# Patient Record
Sex: Female | Born: 1935 | Race: White | Hispanic: No | Smoking: Never smoker
Health system: Southern US, Community
[De-identification: ages and names within clinical notes are randomized; demographics above are authoritative.]

## PROBLEM LIST (undated history)

## (undated) DIAGNOSIS — B009 Herpesviral infection, unspecified: Secondary | ICD-10-CM

## (undated) DIAGNOSIS — H9190 Unspecified hearing loss, unspecified ear: Secondary | ICD-10-CM

## (undated) DIAGNOSIS — F419 Anxiety disorder, unspecified: Secondary | ICD-10-CM

## (undated) DIAGNOSIS — M1990 Unspecified osteoarthritis, unspecified site: Secondary | ICD-10-CM

## (undated) DIAGNOSIS — K529 Noninfective gastroenteritis and colitis, unspecified: Secondary | ICD-10-CM

---

## 2016-07-22 DIAGNOSIS — K52831 Collagenous colitis: Secondary | ICD-10-CM | POA: Insufficient documentation

## 2016-07-22 DIAGNOSIS — F411 Generalized anxiety disorder: Secondary | ICD-10-CM | POA: Insufficient documentation

## 2016-07-22 DIAGNOSIS — Z8619 Personal history of other infectious and parasitic diseases: Secondary | ICD-10-CM | POA: Insufficient documentation

## 2016-07-22 DIAGNOSIS — M170 Bilateral primary osteoarthritis of knee: Secondary | ICD-10-CM | POA: Insufficient documentation

## 2017-01-25 DIAGNOSIS — Z66 Do not resuscitate: Secondary | ICD-10-CM | POA: Insufficient documentation

## 2017-01-25 DIAGNOSIS — Z0000 Encounter for general adult medical examination without abnormal findings: Secondary | ICD-10-CM | POA: Insufficient documentation

## 2017-02-03 DIAGNOSIS — C4492 Squamous cell carcinoma of skin, unspecified: Secondary | ICD-10-CM

## 2017-02-03 DIAGNOSIS — C4491 Basal cell carcinoma of skin, unspecified: Secondary | ICD-10-CM

## 2017-02-03 DIAGNOSIS — L570 Actinic keratosis: Secondary | ICD-10-CM

## 2017-02-03 HISTORY — DX: Basal cell carcinoma of skin, unspecified: C44.91

## 2017-02-03 HISTORY — DX: Actinic keratosis: L57.0

## 2017-02-03 HISTORY — DX: Squamous cell carcinoma of skin, unspecified: C44.92

## 2017-11-14 ENCOUNTER — Ambulatory Visit: Payer: Self-pay | Admitting: Urology

## 2017-12-05 ENCOUNTER — Ambulatory Visit: Payer: Self-pay | Admitting: Urology

## 2018-10-21 ENCOUNTER — Other Ambulatory Visit: Payer: Self-pay

## 2018-10-21 ENCOUNTER — Emergency Department: Payer: Medicare Other

## 2018-10-21 ENCOUNTER — Inpatient Hospital Stay
Admission: EM | Admit: 2018-10-21 | Discharge: 2018-10-23 | DRG: 313 | Disposition: A | Discharge status: Home / Self Care | Payer: Medicare Other | Attending: Internal Medicine | Admitting: Internal Medicine

## 2018-10-21 DIAGNOSIS — K819 Cholecystitis, unspecified: Secondary | ICD-10-CM | POA: Diagnosis present

## 2018-10-21 DIAGNOSIS — R0789 Other chest pain: Secondary | ICD-10-CM | POA: Diagnosis not present

## 2018-10-21 DIAGNOSIS — Z20828 Contact with and (suspected) exposure to other viral communicable diseases: Secondary | ICD-10-CM | POA: Diagnosis present

## 2018-10-21 DIAGNOSIS — R1013 Epigastric pain: Secondary | ICD-10-CM | POA: Diagnosis not present

## 2018-10-21 DIAGNOSIS — I10 Essential (primary) hypertension: Secondary | ICD-10-CM | POA: Diagnosis present

## 2018-10-21 DIAGNOSIS — Z882 Allergy status to sulfonamides status: Secondary | ICD-10-CM

## 2018-10-21 DIAGNOSIS — F419 Anxiety disorder, unspecified: Secondary | ICD-10-CM | POA: Diagnosis present

## 2018-10-21 DIAGNOSIS — R7989 Other specified abnormal findings of blood chemistry: Secondary | ICD-10-CM | POA: Diagnosis present

## 2018-10-21 HISTORY — DX: Anxiety disorder, unspecified: F41.9

## 2018-10-21 LAB — CBC
HCT: 44.6 % (ref 36.0–46.0)
Hemoglobin: 14.5 g/dL (ref 12.0–15.0)
MCH: 30.1 pg (ref 26.0–34.0)
MCHC: 32.5 g/dL (ref 30.0–36.0)
MCV: 92.5 fL (ref 80.0–100.0)
Platelets: 259 10*3/uL (ref 150–400)
RBC: 4.82 MIL/uL (ref 3.87–5.11)
RDW: 13.3 % (ref 11.5–15.5)
WBC: 11.4 10*3/uL — ABNORMAL HIGH (ref 4.0–10.5)
nRBC: 0 % (ref 0.0–0.2)

## 2018-10-21 LAB — BASIC METABOLIC PANEL
Anion gap: 8 (ref 5–15)
BUN: 16 mg/dL (ref 8–23)
CO2: 27 mmol/L (ref 22–32)
Calcium: 9.8 mg/dL (ref 8.9–10.3)
Chloride: 103 mmol/L (ref 98–111)
Creatinine, Ser: 0.7 mg/dL (ref 0.44–1.00)
GFR calc Af Amer: >60 mL/min (ref >60)
GFR calc non Af Amer: >60 mL/min (ref >60)
Glucose, Bld: 115 mg/dL — ABNORMAL HIGH (ref 70–99)
Potassium: 4.1 mmol/L (ref 3.5–5.1)
Sodium: 138 mmol/L (ref 135–145)

## 2018-10-21 LAB — HEPATIC FUNCTION PANEL
ALT: 48 U/L — ABNORMAL HIGH (ref 0–44)
AST: 83 U/L — ABNORMAL HIGH (ref 15–41)
Albumin: 4.3 g/dL (ref 3.5–5.0)
Alkaline Phosphatase: 90 U/L (ref 38–126)
Bilirubin, Direct: 0.5 mg/dL — ABNORMAL HIGH (ref 0.0–0.2)
Indirect Bilirubin: 0.8 mg/dL (ref 0.3–0.9)
Total Bilirubin: 1.3 mg/dL — ABNORMAL HIGH (ref 0.3–1.2)
Total Protein: 6.8 g/dL (ref 6.5–8.1)

## 2018-10-21 LAB — TROPONIN I (HIGH SENSITIVITY)
Troponin I (High Sensitivity): 5 ng/L (ref <18)
Troponin I (High Sensitivity): 4 ng/L (ref <18)

## 2018-10-21 LAB — LIPASE, BLOOD: Lipase: 40 U/L (ref 11–51)

## 2018-10-21 MED ORDER — IOHEXOL 350 MG/ML SOLN
75.0000 mL | Freq: Once | INTRAVENOUS | Status: AC | PRN
  Administered 2018-10-21: 75 mL via INTRAVENOUS

## 2018-10-21 MED ORDER — IBUPROFEN 600 MG PO TABS
600.0000 mg | ORAL_TABLET | Freq: Once | ORAL | Status: AC
  Administered 2018-10-22: 600 mg via ORAL
  Filled 2018-10-21: qty 1

## 2018-10-21 MED ORDER — SODIUM CHLORIDE 0.9 % IV SOLN
Freq: Once | INTRAVENOUS | Status: AC
  Administered 2018-10-22: via INTRAVENOUS

## 2018-10-21 MED ORDER — LABETALOL HCL 5 MG/ML IV SOLN
5.0000 mg | Freq: Once | INTRAVENOUS | Status: AC
  Administered 2018-10-22: 5 mg via INTRAVENOUS
  Filled 2018-10-21: qty 4

## 2018-10-21 MED ORDER — ONDANSETRON HCL 4 MG/2ML IJ SOLN
4.0000 mg | Freq: Four times a day (QID) | INTRAMUSCULAR | Status: DC | PRN
  Filled 2018-10-21: qty 2

## 2018-10-21 MED ORDER — LABETALOL HCL 5 MG/ML IV SOLN
2.5000 mg | Freq: Once | INTRAVENOUS | Status: AC
  Administered 2018-10-21: 2.5 mg via INTRAVENOUS
  Filled 2018-10-21: qty 4

## 2018-10-21 NOTE — ED Provider Notes (Signed)
[facility] Emergency Department Provider Note    First MD Initiated Contact with Patient 10/21/18 2029     (approximate)  I have reviewed the triage vital signs and the nursing notes.   HISTORY  Chief Complaint Back Pain and Chest Pain    HPI Laura Morrow is a 84 y.o. female close past medical history otherwise previously healthy presents to the ER with sudden onset midsternal chest pain that radiated through to her back.  States the pain lasted about 4hours.  Did not become diaphoretic.  Denied any numbness or tingling.  States she had a brief episode that was similar in nature roughly 1 week ago.  Symptoms were not worsened by exertion.  No nausea or vomiting.  Does not smoke.  States that the pain subsided since she has been in the ER.    Past Medical History:  Diagnosis Date   Anxiety    No family history on file. History reviewed. No pertinent surgical history. There are no active problems to display for this patient.     Prior to Admission medications   Not on File    Allergies Sulfa antibiotics    Social History Social History   Tobacco Use   Smoking status: Never Smoker  Substance Use Topics   Alcohol use: Not Currently   Drug use: Not on file    Review of Systems Patient denies headaches, rhinorrhea, blurry vision, numbness, shortness of breath, chest pain, edema, cough, abdominal pain, nausea, vomiting, diarrhea, dysuria, fevers, rashes or hallucinations unless otherwise stated above in HPI. ____________________________________________   PHYSICAL EXAM:  VITAL SIGNS: Vitals:   10/21/18 2100 10/21/18 2230  BP: (!) 171/91 (!) 175/93  Pulse: 77 73  Resp: 17 17  Temp:    SpO2: 96% 98%    Constitutional: Alert and oriented.  Eyes: Conjunctivae are normal.  Head: Atraumatic. Nose: No congestion/rhinnorhea. Mouth/Throat: Mucous membranes are moist.   Neck: No stridor. Painless ROM.  Cardiovascular: Normal  rate, regular rhythm. Grossly normal heart sounds.  Good peripheral circulation. Respiratory: Normal respiratory effort.  No retractions. Lungs CTAB. Gastrointestinal: Soft and nontender. No distention. No abdominal bruits. No CVA tenderness. Genitourinary:  Musculoskeletal: No lower extremity tenderness nor edema.  No joint effusions. Neurologic:  Normal speech and language. No gross focal neurologic deficits are appreciated. No facial droop Skin:  Skin is warm, dry and intact. No rash noted. Psychiatric: Mood and affect are normal. Speech and behavior are normal.  ____________________________________________   LABS (all labs ordered are listed, but only abnormal results are displayed)  Results for orders placed or performed during the hospital encounter of 10/21/18 (from the past 24 hour(s))  Basic metabolic panel     Status: Abnormal   Collection Time: 10/21/18  3:11 PM  Result Value Ref Range   Sodium 138 135 - 145 mmol/L   Potassium 4.1 3.5 - 5.1 mmol/L   Chloride 103 98 - 111 mmol/L   CO2 27 22 - 32 mmol/L   Glucose, Bld 115 (H) 70 - 99 mg/dL   BUN 16 8 - 23 mg/dL   Creatinine, Ser 0.70 0.44 - 1.00 mg/dL   Calcium 9.8 8.9 - 10.3 mg/dL   GFR calc non Af Amer >60 >60 mL/min   GFR calc Af Amer >60 >60 mL/min   Anion gap 8 5 - 15  CBC     Status: Abnormal   Collection Time: 10/21/18  3:11 PM  Result Value Ref Range   WBC 11.4 (  H) 4.0 - 10.5 K/uL   RBC 4.82 3.87 - 5.11 MIL/uL   Hemoglobin 14.5 12.0 - 15.0 g/dL   HCT 44.6 36.0 - 46.0 %   MCV 92.5 80.0 - 100.0 fL   MCH 30.1 26.0 - 34.0 pg   MCHC 32.5 30.0 - 36.0 g/dL   RDW 13.3 11.5 - 15.5 %   Platelets 259 150 - 400 K/uL   nRBC 0.0 0.0 - 0.2 %  Troponin I (High Sensitivity)     Status: None   Collection Time: 10/21/18  3:11 PM  Result Value Ref Range   Troponin I (High Sensitivity) 5 <18 ng/L  Hepatic function panel     Status: Abnormal   Collection Time: 10/21/18  3:11 PM  Result Value Ref Range   Total Protein 6.8  6.5 - 8.1 g/dL   Albumin 4.3 3.5 - 5.0 g/dL   AST 83 (H) 15 - 41 U/L   ALT 48 (H) 0 - 44 U/L   Alkaline Phosphatase 90 38 - 126 U/L   Total Bilirubin 1.3 (H) 0.3 - 1.2 mg/dL   Bilirubin, Direct 0.5 (H) 0.0 - 0.2 mg/dL   Indirect Bilirubin 0.8 0.3 - 0.9 mg/dL  Lipase, blood     Status: None   Collection Time: 10/21/18  3:11 PM  Result Value Ref Range   Lipase 40 11 - 51 U/L  Troponin I (High Sensitivity)     Status: None   Collection Time: 10/21/18  6:19 PM  Result Value Ref Range   Troponin I (High Sensitivity) 4 <18 ng/L   ____________________________________________  EKG My review and personal interpretation at Time: 14:54   Indication: chest pain  Rate: 70  Rhythm: sinus Axis: normal Other: normal intervals, no stemi ____________________________________________  RADIOLOGY  I personally reviewed all radiographic images ordered to evaluate for the above acute complaints and reviewed radiology reports and findings.  These findings were personally discussed with the patient.  Please see medical record for radiology report.  ____________________________________________   PROCEDURES  Procedure(s) performed:  Procedures    Critical Care performed: no ____________________________________________   INITIAL IMPRESSION / ASSESSMENT AND PLAN / ED COURSE  Pertinent labs & imaging results that were available during my care of the patient were reviewed by me and considered in my medical decision making (see chart for details).   DDX: ACS, pericarditis, esophagitis, boerhaaves, pe, dissection, pna, bronchitis, costochondritis   Laura Morrow is a 84 y.o. who presents to the ED with chest pain as described above.  Patient is low risk by heart score with serial enzymes showing no evidence of acute ischemia.  Does not seem clinically consistent with PE.  She was hypertensive and given the pain acutely starting her chest radiating through to her back CT imaging will be ordered to  exclude aneurysm or dissection.  Clinical Course as of Oct 20 2336  Sat Oct 21, 2018  2211 Patient does not have any significant tenderness palpation of the right upper quadrant but given the incidental finding on CT angiogram with her pain radiating through to her back will order ultrasound to further characterize and rule out cholelithiasis or cholecystitis.   [PR]  2322 Case discussed with Dr. Peyton Najjar of surgery.  Is recommended admission to hospitalist for HIDA scan and further medical management.  Discussed with patient.    [PR]    Clinical Course User Index [PR] Merlyn Lot, MD    The patient was evaluated in Emergency Department today for the symptoms described  in the history of present illness. He/she was evaluated in the context of the global COVID-19 pandemic, which necessitated consideration that the patient might be at risk for infection with the SARS-CoV-2 virus that causes COVID-19. Institutional protocols and algorithms that pertain to the evaluation of patients at risk for COVID-19 are in a state of rapid change based on information released by regulatory bodies including the CDC and federal and state organizations. These policies and algorithms were followed during the patient's care in the ED.  As part of my medical decision making, I reviewed the following data within the [facility] notes reviewed and incorporated, Labs reviewed, notes from prior ED visits and [place] Controlled Substance Database   ____________________________________________   FINAL CLINICAL IMPRESSION(S) / ED DIAGNOSES  Final diagnoses:  Epigastric pain      NEW MEDICATIONS STARTED DURING THIS VISIT:  New Prescriptions   No medications on file     Note:  This document was prepared using Dragon voice recognition software and may include unintentional dictation errors.    Merlyn Lot, MD 10/21/18 [phone]

## 2018-10-21 NOTE — ED Triage Notes (Signed)
Reports sudden onset of chest and back pain that started today at 12PM. Denies nausea, diaphoresis, similar onset a few weeks ago that lasted only a few minutes. Pt alert and oriented X4, active, cooperative, pt in NAD. RR even and unlabored, color WNL.

## 2018-10-21 NOTE — ED Notes (Signed)
Reports pain is gone. Inquiring about wait.

## 2018-10-21 NOTE — ED Notes (Signed)
First Nurse Note: Pt c/o chest and back pain x 3 hours. Pt is in NAD. Ambulatory without difficulty.

## 2018-10-22 ENCOUNTER — Other Ambulatory Visit: Payer: Self-pay

## 2018-10-22 ENCOUNTER — Other Ambulatory Visit: Payer: Medicare Other

## 2018-10-22 DIAGNOSIS — K819 Cholecystitis, unspecified: Secondary | ICD-10-CM | POA: Diagnosis present

## 2018-10-22 DIAGNOSIS — R079 Chest pain, unspecified: Secondary | ICD-10-CM

## 2018-10-22 DIAGNOSIS — Z882 Allergy status to sulfonamides status: Secondary | ICD-10-CM | POA: Diagnosis not present

## 2018-10-22 DIAGNOSIS — I10 Essential (primary) hypertension: Secondary | ICD-10-CM | POA: Diagnosis present

## 2018-10-22 DIAGNOSIS — R1013 Epigastric pain: Secondary | ICD-10-CM | POA: Diagnosis present

## 2018-10-22 DIAGNOSIS — Z20828 Contact with and (suspected) exposure to other viral communicable diseases: Secondary | ICD-10-CM | POA: Diagnosis present

## 2018-10-22 DIAGNOSIS — R0789 Other chest pain: Secondary | ICD-10-CM | POA: Diagnosis present

## 2018-10-22 DIAGNOSIS — R7989 Other specified abnormal findings of blood chemistry: Secondary | ICD-10-CM | POA: Diagnosis present

## 2018-10-22 DIAGNOSIS — F419 Anxiety disorder, unspecified: Secondary | ICD-10-CM | POA: Diagnosis present

## 2018-10-22 LAB — HEPATIC FUNCTION PANEL
ALT: 108 U/L — ABNORMAL HIGH (ref 0–44)
AST: 98 U/L — ABNORMAL HIGH (ref 15–41)
Albumin: 3.8 g/dL (ref 3.5–5.0)
Alkaline Phosphatase: 87 U/L (ref 38–126)
Bilirubin, Direct: 0.2 mg/dL (ref 0.0–0.2)
Indirect Bilirubin: 0.9 mg/dL (ref 0.3–0.9)
Total Bilirubin: 1.1 mg/dL (ref 0.3–1.2)
Total Protein: 6 g/dL — ABNORMAL LOW (ref 6.5–8.1)

## 2018-10-22 LAB — BASIC METABOLIC PANEL
Anion gap: 6 (ref 5–15)
BUN: 11 mg/dL (ref 8–23)
CO2: 24 mmol/L (ref 22–32)
Calcium: 9.2 mg/dL (ref 8.9–10.3)
Chloride: 107 mmol/L (ref 98–111)
Creatinine, Ser: 0.52 mg/dL (ref 0.44–1.00)
GFR calc Af Amer: >60 mL/min (ref >60)
GFR calc non Af Amer: >60 mL/min (ref >60)
Glucose, Bld: 111 mg/dL — ABNORMAL HIGH (ref 70–99)
Potassium: 3.9 mmol/L (ref 3.5–5.1)
Sodium: 137 mmol/L (ref 135–145)

## 2018-10-22 LAB — CBC
HCT: 40.8 % (ref 36.0–46.0)
Hemoglobin: 13.1 g/dL (ref 12.0–15.0)
MCH: 30.1 pg (ref 26.0–34.0)
MCHC: 32.1 g/dL (ref 30.0–36.0)
MCV: 93.8 fL (ref 80.0–100.0)
Platelets: 230 10*3/uL (ref 150–400)
RBC: 4.35 MIL/uL (ref 3.87–5.11)
RDW: 13.2 % (ref 11.5–15.5)
WBC: 8.3 10*3/uL (ref 4.0–10.5)
nRBC: 0 % (ref 0.0–0.2)

## 2018-10-22 LAB — TROPONIN I (HIGH SENSITIVITY)
Troponin I (High Sensitivity): 6 ng/L (ref <18)
Troponin I (High Sensitivity): 7 ng/L (ref <18)

## 2018-10-22 LAB — PROTIME-INR
INR: 1 (ref 0.8–1.2)
Prothrombin Time: 13.5 seconds (ref 11.4–15.2)

## 2018-10-22 LAB — SARS CORONAVIRUS 2 (TAT 6-24 HRS): SARS Coronavirus 2: NEGATIVE

## 2018-10-22 MED ORDER — ENOXAPARIN SODIUM 40 MG/0.4ML ~~LOC~~ SOLN
40.0000 mg | SUBCUTANEOUS | Status: DC
  Administered 2018-10-22 – 2018-10-23 (×2): 40 mg via SUBCUTANEOUS
  Filled 2018-10-22 (×2): qty 0.4

## 2018-10-22 MED ORDER — CITALOPRAM HYDROBROMIDE 20 MG PO TABS
40.0000 mg | ORAL_TABLET | Freq: Every day | ORAL | Status: DC
  Administered 2018-10-22 – 2018-10-23 (×2): 40 mg via ORAL
  Filled 2018-10-22 (×2): qty 2

## 2018-10-22 MED ORDER — ONDANSETRON HCL 4 MG PO TABS: 4.0000 mg | ORAL_TABLET | Freq: Four times a day (QID) | ORAL | Status: DC | PRN

## 2018-10-22 MED ORDER — ACETAMINOPHEN 325 MG PO TABS
650.0000 mg | ORAL_TABLET | Freq: Four times a day (QID) | ORAL | Status: DC | PRN
  Administered 2018-10-22: 650 mg via ORAL
  Filled 2018-10-22: qty 2

## 2018-10-22 MED ORDER — VALACYCLOVIR HCL 500 MG PO TABS
500.0000 mg | ORAL_TABLET | Freq: Every day | ORAL | Status: DC
  Administered 2018-10-22 – 2018-10-23 (×2): 500 mg via ORAL
  Filled 2018-10-22 (×2): qty 1

## 2018-10-22 MED ORDER — ONDANSETRON HCL 4 MG/2ML IJ SOLN: 4.0000 mg | Freq: Four times a day (QID) | INTRAMUSCULAR | Status: DC | PRN

## 2018-10-22 MED ORDER — SODIUM CHLORIDE 0.9 % IV SOLN
INTRAVENOUS | Status: DC
  Administered 2018-10-22 – 2018-10-23 (×3): via INTRAVENOUS

## 2018-10-22 NOTE — Progress Notes (Signed)
Patient complaint of mild HA. Covering provider, Gardiner Barefoot, notified. Verbal order for Tylenol taken.

## 2018-10-22 NOTE — ED Notes (Signed)
ED TO INPATIENT HANDOFF REPORT  ED Nurse Name and Phone #: Clifton James  S Name/Age/Gender Laura Morrow 84 y.o. female Room/Bed: ED18A/ED18A  Code Status   Code Status: Full Code  Home/SNF/Other Home Patient oriented to: self, place, time and situation Is this baseline? Yes   Triage Complete: Triage complete  Chief Complaint chest pains  Triage Note Reports sudden onset of chest and back pain that started today at 12PM. Denies nausea, diaphoresis, similar onset a few weeks ago that lasted only a few minutes. Pt alert and oriented X4, active, cooperative, pt in NAD. RR even and unlabored, color WNL.     Allergies Allergies  Allergen Reactions  . Sulfa Antibiotics Swelling    Level of Care/Admitting Diagnosis ED Disposition    ED Disposition Condition [facility] Area: [place] [[id number]]  Level of Care: Med-Surg [16]  Covid Evaluation: Asymptomatic Screening Protocol (No Symptoms)  Diagnosis: Cholecystitis [630160]  Admitting Physician: Mayer Camel [[id number]]  Attending Physician: Mayer Camel [[id number]]  Estimated length of stay: past midnight tomorrow  Certification:: I certify this patient will need inpatient services for at least 2 midnights  PT Class (Do Not Modify): Inpatient [101]  PT Acc Code (Do Not Modify): Private [1]       B Medical/Surgery History Past Medical History:  Diagnosis Date  . Anxiety    History reviewed. No pertinent surgical history.   A IV Location/Drains/Wounds Patient Lines/Drains/Airways Status   Active Line/Drains/Airways    Name:   Placement date:   Placement time:   Site:   Days:   Peripheral IV 10/21/18 Left Antecubital   10/21/18    2100    Antecubital   1          Intake/Output Last 24 hours No intake or output data in the 24 hours ending 10/22/18 0135  Labs/Imaging Results for orders placed or performed during the hospital encounter of 10/21/18 (from the past 48  hour(s))  Basic metabolic panel     Status: Abnormal   Collection Time: 10/21/18  3:11 PM  Result Value Ref Range   Sodium 138 135 - 145 mmol/L   Potassium 4.1 3.5 - 5.1 mmol/L   Chloride 103 98 - 111 mmol/L   CO2 27 22 - 32 mmol/L   Glucose, Bld 115 (H) 70 - 99 mg/dL   BUN 16 8 - 23 mg/dL   Creatinine, Ser 0.70 0.44 - 1.00 mg/dL   Calcium 9.8 8.9 - 10.3 mg/dL   GFR calc non Af Amer >60 >60 mL/min   GFR calc Af Amer >60 >60 mL/min   Anion gap 8 5 - 15    Comment: Performed at [facility], [address] [place], [place] [address]  CBC     Status: Abnormal   Collection Time: 10/21/18  3:11 PM  Result Value Ref Range   WBC 11.4 (H) 4.0 - 10.5 K/uL   RBC 4.82 3.87 - 5.11 MIL/uL   Hemoglobin 14.5 12.0 - 15.0 g/dL   HCT 44.6 36.0 - 46.0 %   MCV 92.5 80.0 - 100.0 fL   MCH 30.1 26.0 - 34.0 pg   MCHC 32.5 30.0 - 36.0 g/dL   RDW 13.3 11.5 - 15.5 %   Platelets 259 150 - 400 K/uL   nRBC 0.0 0.0 - 0.2 %    Comment: Performed at [facility], [address], [place], [place] [address]  Troponin I (High Sensitivity)  Status: None   Collection Time: 10/21/18  3:11 PM  Result Value Ref Range   Troponin I (High Sensitivity) 5 <18 ng/L    Comment: (NOTE) Elevated high sensitivity troponin I (hsTnI) values and significant  changes across serial measurements may suggest ACS but many other  chronic and acute conditions are known to elevate hsTnI results.  Refer to the "Links" section for chest pain algorithms and additional  guidance. Performed at [facility], [address] [place], [place] [address]   Hepatic function panel     Status: Abnormal   Collection Time: 10/21/18  3:11 PM  Result Value Ref Range   Total Protein 6.8 6.5 - 8.1 g/dL   Albumin 4.3 3.5 - 5.0 g/dL   AST 83 (H) 15 - 41 U/L   ALT 48 (H) 0 - 44 U/L   Alkaline Phosphatase 90 38 - 126 U/L   Total Bilirubin 1.3 (H) 0.3 - 1.2 mg/dL   Bilirubin, Direct 0.5 (H) 0.0 - 0.2 mg/dL    Indirect Bilirubin 0.8 0.3 - 0.9 mg/dL    Comment: Performed at [facility], [address] [place], [place] [address]  Lipase, blood     Status: None   Collection Time: 10/21/18  3:11 PM  Result Value Ref Range   Lipase 40 11 - 51 U/L    Comment: Performed at [facility], [place], [place] [address]  Troponin I (High Sensitivity)     Status: None   Collection Time: 10/21/18  6:19 PM  Result Value Ref Range   Troponin I (High Sensitivity) 4 <18 ng/L    Comment: (NOTE) Elevated high sensitivity troponin I (hsTnI) values and significant  changes across serial measurements may suggest ACS but many other  chronic and acute conditions are known to elevate hsTnI results.  Refer to the "Links" section for chest pain algorithms and additional  guidance. Performed at [facility], [address] [place], [place] [address]    Dg Chest 2 View  Result Date: 10/21/2018 CLINICAL DATA:  Patient reports central and right side CP that radiates into her back. Symptoms onset approx. noon today. Denies SOB, cough or fever. Denies any known cardiopulmonary conditions. Non-smoker. EXAM: CHEST - 2 VIEW COMPARISON:  None. FINDINGS: Cardiac silhouette is normal in size. No mediastinal or hilar masses or evidence of adenopathy. Lungs are hyperexpanded. Mildly prominent bronchovascular markings. Mild linear scarring noted in the lung bases and apices. No pneumonia or pulmonary edema. No pleural effusion or pneumothorax. Skeletal structures are demineralized. IMPRESSION: No acute cardiopulmonary disease. Electronically Signed   By: Lajean Manes M.D.   On: 10/21/2018 15:48   Ct Angio Chest Aorta W And/or Wo Contrast  Result Date: 10/21/2018 CLINICAL DATA:  Complex chest pain. EXAM: CT ANGIOGRAPHY CHEST WITH CONTRAST TECHNIQUE: Multidetector CT imaging of the chest was performed using the standard protocol during bolus administration of intravenous contrast. Multiplanar  CT image reconstructions and MIPs were obtained to evaluate the vascular anatomy. CONTRAST:  39mL OMNIPAQUE IOHEXOL 350 MG/ML SOLN COMPARISON:  None. FINDINGS: Cardiovascular: Evaluation is somewhat limited by contrast injection preferentially opacifying the aorta. Given this limitation, there was no PE identified. There is no dissection. There is no significant pericardial effusion. Coronary artery calcifications are noted. Mediastinum/Nodes: --No mediastinal or hilar lymphadenopathy. --No axillary lymphadenopathy. --No supraclavicular lymphadenopathy. --Normal thyroid gland. --The esophagus is unremarkable Lungs/Pleura: There is chronic appearing consolidation involving the right middle lobe and right lower lobe. There is atelectasis  versus scarring at the left lung base. No pneumothorax. No large pleural effusion. The trachea is unremarkable. Upper Abdomen: The partially visualized upper abdomen demonstrates diffuse gallbladder wall thickening of the partially visualized gallbladder. Musculoskeletal: No chest wall abnormality. No acute or significant osseous findings. Review of the MIP images confirms the above findings. IMPRESSION: 1. No PE identified.  No dissection. 2. Diffuse gallbladder wall thickening involving the partially visualized gallbladder. Follow-up with ultrasound is recommended to help exclude acute cholecystitis. Electronically Signed   By: Constance Holster M.D.   On: 10/21/2018 21:46   US Abdomen Limited Ruq  Result Date: 10/21/2018 CLINICAL DATA:  Epigastric pain EXAM: ULTRASOUND ABDOMEN LIMITED RIGHT UPPER QUADRANT COMPARISON:  None. FINDINGS: Gallbladder: There is diffuse gallbladder wall thickening with the gallbladder measuring 6 mm in thickness. There is pericholecystic free fluid. There is gallbladder sludge. The sonographic Percell Miller sign is reported as negative. Common bile duct: Diameter: 4 mm Liver: No focal lesion identified. Within normal limits in parenchymal echogenicity.  Portal vein is patent on color Doppler imaging with normal direction of blood flow towards the liver. Other: None. IMPRESSION: Gallbladder sludge with nonspecific gallbladder wall thickening. The sonographic Percell Miller sign is negative. If there is persistent clinical concern for acute cholecystitis follow-up with HIDA scan is recommended. Electronically Signed   By: Constance Holster M.D.   On: 10/21/2018 22:23    Pending Labs Unresulted Labs (From admission, onward)    Start     Ordered   10/29/18 0500  Creatinine, serum  (enoxaparin (LOVENOX)    CrCl >/= 30 ml/min)  Weekly,   STAT    Comments: while on enoxaparin therapy    10/22/18 0104   10/22/18 3662  Basic metabolic panel  Tomorrow morning,   STAT     10/22/18 0104   10/22/18 0500  CBC  Tomorrow morning,   STAT     10/22/18 0104   10/22/18 0500  Protime-INR  Tomorrow morning,   STAT     10/22/18 0104   10/22/18 0500  Hepatic function panel  Daily,   STAT     10/22/18 0104   10/22/18 0105  CBC  (enoxaparin (LOVENOX)    CrCl >/= 30 ml/min)  Once,   STAT    Comments: Baseline for enoxaparin therapy IF NOT ALREADY DRAWN.  Notify MD if PLT < 100 K.    10/22/18 0104   10/22/18 0105  Creatinine, serum  (enoxaparin (LOVENOX)    CrCl >/= 30 ml/min)  Once,   STAT    Comments: Baseline for enoxaparin therapy IF NOT ALREADY DRAWN.    10/22/18 0104          Vitals/Pain Today's Vitals   10/21/18 2230 10/21/18 2300 10/22/18 0000 10/22/18 0130  BP: (!) 175/93 (!) 197/99 (!) 170/91 (!) 153/92  Pulse: 73 80 72 67  Resp: 17 18 13  (!) 21  Temp:      TempSrc:      SpO2: 98% 99% 97% 94%  Weight:      Height:      PainSc:        Isolation Precautions No active isolations  Medications Medications  ondansetron (ZOFRAN) injection 4 mg (has no administration in time range)  citalopram (CELEXA) tablet 40 mg (has no administration in time range)  valACYclovir (VALTREX) tablet 500 mg (has no administration in time range)  enoxaparin  (LOVENOX) injection 40 mg (has no administration in time range)  0.9 %  sodium chloride infusion (has no administration in  time range)  ondansetron (ZOFRAN) tablet 4 mg (has no administration in time range)    Or  ondansetron (ZOFRAN) injection 4 mg (has no administration in time range)  labetalol (NORMODYNE) injection 2.5 mg (2.5 mg Intravenous Given 10/21/18 2107)  iohexol (OMNIPAQUE) 350 MG/ML injection 75 mL (75 mLs Intravenous Contrast Given 10/21/18 2126)  0.9 %  sodium chloride infusion ( Intravenous New Bag/Given 10/22/18 0022)  ibuprofen (ADVIL) tablet 600 mg (600 mg Oral Given 10/22/18 0019)  labetalol (NORMODYNE) injection 5 mg (5 mg Intravenous Given 10/22/18 0026)    Mobility walks Low fall risk   Focused Assessments Cardiac Assessment Handoff:  Cardiac Rhythm: Normal sinus rhythm No results found for: CKTOTAL, CKMB, CKMBINDEX, TROPONINI No results found for: DDIMER Does the Patient currently have chest pain? No      R Recommendations: See Admitting Provider Note  Report given to:   Additional Notes:

## 2018-10-22 NOTE — Plan of Care (Signed)

## 2018-10-22 NOTE — Progress Notes (Signed)
[facility] [place] NAME: Laura Morrow    MR#:  [id number]  DATE OF BIRTH:  1934/12/24  SUBJECTIVE:  CHIEF COMPLAINT:   Chief Complaint  Patient presents with  . Back Pain  . Chest Pain   Patient denies any chest pain this morning.  Denies any abdominal pains.  No fevers.  HIDA scan already ordered.  REVIEW OF SYSTEMS:  Review of Systems  Constitutional: Negative for chills and fever.  HENT: Negative for hearing loss and tinnitus.   Eyes: Negative for blurred vision and double vision.  Respiratory: Negative for cough and hemoptysis.   Cardiovascular: Negative for chest pain and palpitations.  Gastrointestinal: Negative for abdominal pain, heartburn, nausea and vomiting.  Genitourinary: Negative for dysuria and urgency.  Musculoskeletal: Negative for myalgias and neck pain.  Skin: Negative for itching and rash.  Neurological: Negative for dizziness and headaches.  Psychiatric/Behavioral: Negative for depression and hallucinations.    DRUG ALLERGIES:   Allergies  Allergen Reactions  . Sulfa Antibiotics Swelling   VITALS:  Blood pressure (!) 158/81, pulse 81, temperature 97.7 F (36.5 C), temperature source Oral, resp. rate 20, height 5\' 3"  (1.6 m), weight 63.8 kg, SpO2 97 %. PHYSICAL EXAMINATION:  Physical Exam  GENERAL:  84 y.o.-year-old patient lying in the bed with no acute distress.  EYES: Pupils equal, round, reactive to light and accommodation. No scleral icterus. Extraocular muscles intact.  HEENT: Head atraumatic, normocephalic. Oropharynx and nasopharynx clear.  NECK:  Supple, no jugular venous distention. No thyroid enlargement, no tenderness.  LUNGS: Normal breath sounds bilaterally, no wheezing, rales,rhonchi or crepitation. No use of accessory muscles of respiration.  CARDIOVASCULAR: Regular rate and rhythm, S1, S2 normal. No murmurs, rubs, or gallops.  ABDOMEN: Soft, nondistended, nontender. Bowel sounds  present. No organomegaly or mass.  EXTREMITIES: No pedal edema, cyanosis, or clubbing.  NEUROLOGIC: Cranial nerves II through XII are intact. Muscle strength 5/5 in all extremities. Sensation intact. Gait not checked.  PSYCHIATRIC: The patient is alert and oriented x 3.  Normal affect and good eye contact. SKIN: No obvious rash, lesion, or ulcer.   LABORATORY PANEL:  Female CBC Recent Labs  Lab 10/22/18 0429  WBC 8.3  HGB 13.1  HCT 40.8  PLT 230   ------------------------------------------------------------------------------------------------------------------ Chemistries  Recent Labs  Lab 10/22/18 0429  NA 137  K 3.9  CL 107  CO2 24  GLUCOSE 111*  BUN 11  CREATININE 0.52  CALCIUM 9.2  AST 98*  ALT 108*  ALKPHOS 87  BILITOT 1.1   RADIOLOGY:  Dg Chest 2 View  Result Date: 10/21/2018 CLINICAL DATA:  Patient reports central and right side CP that radiates into her back. Symptoms onset approx. noon today. Denies SOB, cough or fever. Denies any known cardiopulmonary conditions. Non-smoker. EXAM: CHEST - 2 VIEW COMPARISON:  None. FINDINGS: Cardiac silhouette is normal in size. No mediastinal or hilar masses or evidence of adenopathy. Lungs are hyperexpanded. Mildly prominent bronchovascular markings. Mild linear scarring noted in the lung bases and apices. No pneumonia or pulmonary edema. No pleural effusion or pneumothorax. Skeletal structures are demineralized. IMPRESSION: No acute cardiopulmonary disease. Electronically Signed   By: Lajean Manes M.D.   On: 10/21/2018 15:48   Ct Angio Chest Aorta W And/or Wo Contrast  Result Date: 10/21/2018 CLINICAL DATA:  Complex chest pain. EXAM: CT ANGIOGRAPHY CHEST WITH CONTRAST TECHNIQUE: Multidetector CT imaging of the chest was performed using the standard protocol during bolus administration of intravenous  contrast. Multiplanar CT image reconstructions and MIPs were obtained to evaluate the vascular anatomy. CONTRAST:  25mL OMNIPAQUE  IOHEXOL 350 MG/ML SOLN COMPARISON:  None. FINDINGS: Cardiovascular: Evaluation is somewhat limited by contrast injection preferentially opacifying the aorta. Given this limitation, there was no PE identified. There is no dissection. There is no significant pericardial effusion. Coronary artery calcifications are noted. Mediastinum/Nodes: --No mediastinal or hilar lymphadenopathy. --No axillary lymphadenopathy. --No supraclavicular lymphadenopathy. --Normal thyroid gland. --The esophagus is unremarkable Lungs/Pleura: There is chronic appearing consolidation involving the right middle lobe and right lower lobe. There is atelectasis versus scarring at the left lung base. No pneumothorax. No large pleural effusion. The trachea is unremarkable. Upper Abdomen: The partially visualized upper abdomen demonstrates diffuse gallbladder wall thickening of the partially visualized gallbladder. Musculoskeletal: No chest wall abnormality. No acute or significant osseous findings. Review of the MIP images confirms the above findings. IMPRESSION: 1. No PE identified.  No dissection. 2. Diffuse gallbladder wall thickening involving the partially visualized gallbladder. Follow-up with ultrasound is recommended to help exclude acute cholecystitis. Electronically Signed   By: Constance Holster M.D.   On: 10/21/2018 21:46   US Abdomen Limited Ruq  Result Date: 10/21/2018 CLINICAL DATA:  Epigastric pain EXAM: ULTRASOUND ABDOMEN LIMITED RIGHT UPPER QUADRANT COMPARISON:  None. FINDINGS: Gallbladder: There is diffuse gallbladder wall thickening with the gallbladder measuring 6 mm in thickness. There is pericholecystic free fluid. There is gallbladder sludge. The sonographic Percell Miller sign is reported as negative. Common bile duct: Diameter: 4 mm Liver: No focal lesion identified. Within normal limits in parenchymal echogenicity. Portal vein is patent on color Doppler imaging with normal direction of blood flow towards the liver. Other:  None. IMPRESSION: Gallbladder sludge with nonspecific gallbladder wall thickening. The sonographic Percell Miller sign is negative. If there is persistent clinical concern for acute cholecystitis follow-up with HIDA scan is recommended. Electronically Signed   By: Constance Holster M.D.   On: 10/21/2018 22:23   ASSESSMENT AND PLAN:   1.    Atypical chest pain Patient presented to the emergency room last night with atypical chest pain.  Resolved.  Admitted by nurse practitioner last night.  Patient completely asymptomatic this morning. Acute coronary syndrome ruled out. CTA chest done with no evidence of pulmonary embolism Likely musculoskeletal in origin. COVID tests ordered but results still pending.  2.  Gallbladder wall thickening Incidental finding on CT scan of the abdomen and pelvis.  Patient denies having any abdominal pains. Right upper quadrant ultrasound done revealed gallbladder sludge with nonspecific gallbladder wall thickening. Thesonographic Murphy sign is negative. If there is persistent clinical concern for acute cholecystitis follow-up with HIDA scan was recommended which has been ordered. Follow-up on report of HIDA scan.  Will consider surgery consult if HIDA scan suggestive of cholecystitis.  Clinically no evidence of cholecystitis at this time.  3.  Elevated LFTs - We will continue to trend LFTs Requested for hepatitis panel  DVT prophylaxis; Lovenox  All the records are reviewed and case discussed with Care Management/Social Worker. Management plans discussed with the patient, and she is in agreement.  CODE STATUS: Full Code  TOTAL TIME TAKING CARE OF THIS PATIENT: 35 minutes.   More than 50% of the time was spent in counseling/coordination of care: YES  POSSIBLE D/C IN 2 DAYS, DEPENDING ON CLINICAL CONDITION.   Skylene Deremer M.D on 10/22/2018 at 1:48 PM  Between 7am to 6pm - Pager - [phone]  After 6pm go to [email or web address] - [place]  [facility]  Office  [phone]  CC: Primary care physician; [place]  Note: This dictation was prepared with Dragon dictation along with smaller phrase technology. Any transcriptional errors that result from this process are unintentional.

## 2018-10-22 NOTE — H&P (Signed)
Laura Morrow NAME: Laura Morrow    MR#:  [id number]  DATE OF BIRTH:  1935/01/27  DATE OF ADMISSION:  10/21/2018  PRIMARY CARE PHYSICIAN: [place]   REQUESTING/REFERRING PHYSICIAN: Merlyn Lot, MD  CHIEF COMPLAINT:   Chief Complaint  Patient presents with   Back Pain   Chest Pain    HISTORY OF PRESENT ILLNESS:  Laura Morrow  is a 84 y.o. female with a known history of anxiety.  She presented to the emergency room reporting sudden onset midsternal chest pain radiating through to her back which began around 12 PM lasting 4 to 5 hours.  Pain was described as aching with a pain score 5 out of 10 with no alleviating or exacerbating factors.  Pain began while she was sitting at the kitchen table.  She denies associated nausea, vomiting, shortness of breath, diarrhea.  She denies palpitations or recent illness.  She denies cough.  She denies associated diaphoresis, numbness, tingling, or lightheadedness.  She recalls having had a similar episode which lasted less than 5 minutes and occurred about 1 week ago.  She has no prior known history of MI or CVA.  CTA chest demonstrated no evidence of pulmonary embolism or dissection.  However there was evidence of diffuse gallbladder wall thickening involving the partially visualized gallbladder recommending follow-up ultrasound.  Dominant ultrasound was completed demonstrating gallbladder sludge with nonspecific gallbladder wall thickening.  Murphy sign is negative.  Recommended follow-up with HIDA scan.  Chest x-ray was completed demonstrating no acute pulmonary disease.  COVID-19 testing results are pending.  WBC is 11.4.  Patient was noted to have elevated AST of 98 with ALT 108.  Troponin is 4.  She has been admitted to the hospitalist service for further evaluation and recommendations.  PAST MEDICAL HISTORY:   Past Medical History:  Diagnosis Date   Anxiety     PAST  SURGICAL HISTORY:  History reviewed. No pertinent surgical history.  SOCIAL HISTORY:   Social History   Tobacco Use   Smoking status: Never Smoker   Smokeless tobacco: Never Used  Substance Use Topics   Alcohol use: Not Currently    FAMILY HISTORY:  History reviewed. No pertinent family history.  DRUG ALLERGIES:   Allergies  Allergen Reactions   Sulfa Antibiotics Swelling    REVIEW OF SYSTEMS:   Review of Systems  Constitutional: Negative for chills, fever and malaise/fatigue.  HENT: Negative for congestion, sinus pain and sore throat.   Eyes: Negative for blurred vision and double vision.  Respiratory: Negative for hemoptysis, sputum production and shortness of breath.   Cardiovascular: Positive for chest pain. Negative for palpitations, orthopnea and leg swelling.  Gastrointestinal: Positive for abdominal pain (epigastric). Negative for diarrhea, heartburn, nausea and vomiting.  Genitourinary: Negative for dysuria, flank pain and hematuria.  Musculoskeletal: Positive for back pain. Negative for falls and myalgias.  Neurological: Negative for dizziness and headaches.  Psychiatric/Behavioral: Negative for depression.    MEDICATIONS AT HOME:   Prior to Admission medications   Medication Sig Start Date End Date Taking? Authorizing Provider  citalopram (CELEXA) 40 MG tablet Take 1 tablet by mouth daily. 07/21/18  Yes [provider]  valACYclovir (VALTREX) 500 MG tablet Take 500 mg by mouth daily. 07/21/18  Yes [provider]      VITAL SIGNS:  Blood pressure (!) 158/81, pulse 81, temperature 97.7 F (36.5 C), temperature source Oral, resp. rate 20, height 5\' 3"  (1.6 m),  weight 63.5 kg, SpO2 97 %.  PHYSICAL EXAMINATION:  Physical Exam  GENERAL:  84 y.o.-year-old patient lying in the bed with no acute distress.  EYES: Pupils equal, round, reactive to light and accommodation. No scleral icterus. Extraocular muscles intact.  HEENT: Head  atraumatic, normocephalic. Oropharynx and nasopharynx clear.  NECK:  Supple, no jugular venous distention. No thyroid enlargement, no tenderness.  LUNGS: Normal breath sounds bilaterally, no wheezing, rales,rhonchi or crepitation. No use of accessory muscles of respiration.  CARDIOVASCULAR: Regular rate and rhythm, S1, S2 normal. No murmurs, rubs, or gallops.  ABDOMEN: Soft, nondistended,mild upper abdominal tenderness. Bowel sounds present. No organomegaly or mass.  EXTREMITIES: No pedal edema, cyanosis, or clubbing.  NEUROLOGIC: Cranial nerves II through XII are intact. Muscle strength 5/5 in all extremities. Sensation intact. Gait not checked.  PSYCHIATRIC: The patient is alert and oriented x 3.  Normal affect and good eye contact. SKIN: No obvious rash, lesion, or ulcer.   LABORATORY PANEL:   CBC Recent Labs  Lab 10/22/18 0429  WBC 8.3  HGB 13.1  HCT 40.8  PLT 230   ------------------------------------------------------------------------------------------------------------------  Chemistries  Recent Labs  Lab 10/22/18 0429  NA 137  K 3.9  CL 107  CO2 24  GLUCOSE 111*  BUN 11  CREATININE 0.52  CALCIUM 9.2  AST 98*  ALT 108*  ALKPHOS 87  BILITOT 1.1   ------------------------------------------------------------------------------------------------------------------  Cardiac Enzymes No results for input(s): TROPONINI in the last 168 hours. ------------------------------------------------------------------------------------------------------------------  RADIOLOGY:  Dg Chest 2 View  Result Date: 10/21/2018 CLINICAL DATA:  Patient reports central and right side CP that radiates into her back. Symptoms onset approx. noon today. Denies SOB, cough or fever. Denies any known cardiopulmonary conditions. Non-smoker. EXAM: CHEST - 2 VIEW COMPARISON:  None. FINDINGS: Cardiac silhouette is normal in size. No mediastinal or hilar masses or evidence of adenopathy. Lungs are  hyperexpanded. Mildly prominent bronchovascular markings. Mild linear scarring noted in the lung bases and apices. No pneumonia or pulmonary edema. No pleural effusion or pneumothorax. Skeletal structures are demineralized. IMPRESSION: No acute cardiopulmonary disease. Electronically Signed   By: Lajean Manes M.D.   On: 10/21/2018 15:48   Ct Angio Chest Aorta W And/or Wo Contrast  Result Date: 10/21/2018 CLINICAL DATA:  Complex chest pain. EXAM: CT ANGIOGRAPHY CHEST WITH CONTRAST TECHNIQUE: Multidetector CT imaging of the chest was performed using the standard protocol during bolus administration of intravenous contrast. Multiplanar CT image reconstructions and MIPs were obtained to evaluate the vascular anatomy. CONTRAST:  38mL OMNIPAQUE IOHEXOL 350 MG/ML SOLN COMPARISON:  None. FINDINGS: Cardiovascular: Evaluation is somewhat limited by contrast injection preferentially opacifying the aorta. Given this limitation, there was no PE identified. There is no dissection. There is no significant pericardial effusion. Coronary artery calcifications are noted. Mediastinum/Nodes: --No mediastinal or hilar lymphadenopathy. --No axillary lymphadenopathy. --No supraclavicular lymphadenopathy. --Normal thyroid gland. --The esophagus is unremarkable Lungs/Pleura: There is chronic appearing consolidation involving the right middle lobe and right lower lobe. There is atelectasis versus scarring at the left lung base. No pneumothorax. No large pleural effusion. The trachea is unremarkable. Upper Abdomen: The partially visualized upper abdomen demonstrates diffuse gallbladder wall thickening of the partially visualized gallbladder. Musculoskeletal: No chest wall abnormality. No acute or significant osseous findings. Review of the MIP images confirms the above findings. IMPRESSION: 1. No PE identified.  No dissection. 2. Diffuse gallbladder wall thickening involving the partially visualized gallbladder. Follow-up with ultrasound  is recommended to help exclude acute cholecystitis. Electronically Signed   By:  Constance Holster M.D.   On: 10/21/2018 21:46   US Abdomen Limited Ruq  Result Date: 10/21/2018 CLINICAL DATA:  Epigastric pain EXAM: ULTRASOUND ABDOMEN LIMITED RIGHT UPPER QUADRANT COMPARISON:  None. FINDINGS: Gallbladder: There is diffuse gallbladder wall thickening with the gallbladder measuring 6 mm in thickness. There is pericholecystic free fluid. There is gallbladder sludge. The sonographic Percell Miller sign is reported as negative. Common bile duct: Diameter: 4 mm Liver: No focal lesion identified. Within normal limits in parenchymal echogenicity. Portal vein is patent on color Doppler imaging with normal direction of blood flow towards the liver. Other: None. IMPRESSION: Gallbladder sludge with nonspecific gallbladder wall thickening. The sonographic Percell Miller sign is negative. If there is persistent clinical concern for acute cholecystitis follow-up with HIDA scan is recommended. Electronically Signed   By: Constance Holster M.D.   On: 10/21/2018 22:23      IMPRESSION AND PLAN:   1.  Chest pain - We will continue to trend troponin levels -Repeat EKG in the a.m. -Telemetry monitoring -Lipid panel  2. acute cholecystitis - We will get HIDA scan as recommended and consult surgery if indicated at that time. - We will treat pain with analgesic  3.  Elevated LFTs - We will continue to trend LFTs  DVT and PPI prophylaxis    All the records are reviewed and case discussed with ED provider. The plan of care was discussed in details with the patient (and family). I answered all questions. The patient agreed to proceed with the above mentioned plan. Further management will depend upon hospital course.   CODE STATUS: Full code  TOTAL TIME TAKING CARE OF THIS PATIENT: 45 minutes.    Theo Dills Josy Peaden crnpon 10/22/2018 at 6:33 AM  Pager - [phone]  After 6pm go to [email or web address] - [employer]  [facility]  Office  [phone]  CC: Primary care physician; [place]   Note: This dictation was prepared with Dragon dictation along with smaller phrase technology. Any transcriptional errors that result from this process are unintentional.

## 2018-10-23 ENCOUNTER — Inpatient Hospital Stay: Payer: Medicare Other

## 2018-10-23 LAB — COMPREHENSIVE METABOLIC PANEL
ALT: 74 U/L — ABNORMAL HIGH (ref 0–44)
AST: 48 U/L — ABNORMAL HIGH (ref 15–41)
Albumin: 3.9 g/dL (ref 3.5–5.0)
Alkaline Phosphatase: 79 U/L (ref 38–126)
Anion gap: 5 (ref 5–15)
BUN: 10 mg/dL (ref 8–23)
CO2: 26 mmol/L (ref 22–32)
Calcium: 9.3 mg/dL (ref 8.9–10.3)
Chloride: 111 mmol/L (ref 98–111)
Creatinine, Ser: 0.58 mg/dL (ref 0.44–1.00)
GFR calc Af Amer: >60 mL/min (ref >60)
GFR calc non Af Amer: >60 mL/min (ref >60)
Glucose, Bld: 97 mg/dL (ref 70–99)
Potassium: 3.7 mmol/L (ref 3.5–5.1)
Sodium: 142 mmol/L (ref 135–145)
Total Bilirubin: 0.9 mg/dL (ref 0.3–1.2)
Total Protein: 6.1 g/dL — ABNORMAL LOW (ref 6.5–8.1)

## 2018-10-23 LAB — MAGNESIUM: Magnesium: 2.1 mg/dL (ref 1.7–2.4)

## 2018-10-23 MED ORDER — TECHNETIUM TC 99M MEBROFENIN IV KIT
5.0000 | PACK | Freq: Once | INTRAVENOUS | Status: AC | PRN
  Administered 2018-10-23: 5.34 via INTRAVENOUS

## 2018-10-23 NOTE — Progress Notes (Signed)
Per MD okay for RN to DC telemetry 

## 2018-10-23 NOTE — Progress Notes (Signed)
Per MD okay for RN to DC IVF and order [facility] healthy diet.

## 2018-10-23 NOTE — Progress Notes (Signed)
Kathaleya Recore  A and O x 4. VSS. Pt tolerating diet well. No complaints of pain or nausea. IV removed intact, prescriptions given. Pt voiced understanding of discharge instructions with no further questions. Pt discharged to [facility].   Allergies as of 10/23/2018      Reactions   Sulfa Antibiotics Swelling      Medication List    TAKE these medications   citalopram 40 MG tablet Commonly known as: CELEXA Take 1 tablet by mouth daily.   valACYclovir 500 MG tablet Commonly known as: VALTREX Take 500 mg by mouth daily.       Vitals:   10/23/18 1337 10/23/18 1454  BP: (!) 168/89 132/87  Pulse: 83 (!) 109  Resp:    Temp:    SpO2:      Francesco Sor

## 2018-10-23 NOTE — Discharge Summary (Signed)
[facility] [place] NAME: Laura Morrow    MR#:  [id number]  DATE OF BIRTH:  1934-08-30  DATE OF ADMISSION:  10/21/2018   ADMITTING PHYSICIAN: Christel Mormon, MD  DATE OF DISCHARGE: 10/23/2018  PRIMARY CARE PHYSICIAN: [place]   ADMISSION DIAGNOSIS:  Epigastric pain [R10.13] Hypertension, unspecified type [I10] DISCHARGE DIAGNOSIS:  Active Problems:   Cholecystitis  SECONDARY DIAGNOSIS:   Past Medical History:  Diagnosis Date  . Anxiety    HOSPITAL COURSE:  Chief complaint; chest pain  History of presenting complaint; Laura Morrow  is a 84 y.o. female with a known history of anxiety.  She presented to the emergency room reporting sudden onset midsternal chest pain radiating through to her back which began around 12 PM lasting 4 to 5 hours. CTA chest demonstrated no evidence of pulmonary embolism or dissection.  However there was evidence of diffuse gallbladder wall thickening involving the partially visualized gallbladder recommending follow-up ultrasound.  Dominant ultrasound was completed demonstrating gallbladder sludge with nonspecific gallbladder wall thickening.  Murphy sign is negative.  Recommended follow-up with HIDA scan.  Chest x-ray was completed demonstrating no acute pulmonary disease.   Hospital course; 1.  Atypical chest pain Patient presented to the emergency room last night with atypical chest pain.  Resolved.  Admitted by nurse practitioner last night.  Patient completely asymptomatic since admission. Acute coronary syndrome ruled out.CTA chest done with no evidence of pulmonary embolism Likely musculoskeletal in origin.COVID tests done was negative.  2.  Gallbladder wall thickening Incidental finding on CT scan of the abdomen and pelvis.  Patient denies having any abdominal pains.Right upper quadrant ultrasound done revealed gallbladder sludge with nonspecific gallbladder wall thickening.  Thesonographic Murphy sign is negative.  Patient subsequently had HIDA scan done which came back normal.  Patient remains completely asymptomatic and wishes to be discharged home today. Follow-up with primary care physician for ongoing monitoring  3. Elevated LFTs Trended down.  Hepatitis panel requested but results still pending.  Primary care physician to follow-up on results.  Patient completely asymptomatic.  No abdominal pains.  I called and updated daughter on treatment and discharge plans.  All questions were answered.  DISCHARGE CONDITIONS:  Stable CONSULTS OBTAINED:   DRUG ALLERGIES:   Allergies  Allergen Reactions  . Sulfa Antibiotics Swelling   DISCHARGE MEDICATIONS:   Allergies as of 10/23/2018      Reactions   Sulfa Antibiotics Swelling      Medication List    TAKE these medications   citalopram 40 MG tablet Commonly known as: CELEXA Take 1 tablet by mouth daily.   valACYclovir 500 MG tablet Commonly known as: VALTREX Take 500 mg by mouth daily.        DISCHARGE INSTRUCTIONS:   DIET:  Cardiac diet DISCHARGE CONDITION:  Stable ACTIVITY:  Activity as tolerated OXYGEN:  Home Oxygen: No.  Oxygen Delivery: room air DISCHARGE LOCATION:  home   If you experience worsening of your admission symptoms, develop shortness of breath, life threatening emergency, suicidal or homicidal thoughts you must seek medical attention immediately by calling 911 or calling your MD immediately  if symptoms less severe.  You Must read complete instructions/literature along with all the possible adverse reactions/side effects for all the Medicines you take and that have been prescribed to you. Take any new Medicines after you have completely understood and accpet all the possible adverse reactions/side effects.   Please note  You were cared for  by a hospitalist during your hospital stay. If you have any questions about your discharge medications or the care you received  while you were in the hospital after you are discharged, you can call the unit and asked to speak with the hospitalist on call if the hospitalist that took care of you is not available. Once you are discharged, your primary care physician will handle any further medical issues. Please note that NO REFILLS for any discharge medications will be authorized once you are discharged, as it is imperative that you return to your primary care physician (or establish a relationship with a primary care physician if you do not have one) for your aftercare needs so that they can reassess your need for medications and monitor your lab values.    On the day of Discharge:  VITAL SIGNS:  Blood pressure (!) 168/89, pulse 83, temperature 98.5 F (36.9 C), temperature source Oral, resp. rate 17, height 5\' 3"  (1.6 m), weight 63.8 kg, SpO2 96 %. PHYSICAL EXAMINATION:  GENERAL:  84 y.o.-year-old patient lying in the bed with no acute distress.  EYES: Pupils equal, round, reactive to light and accommodation. No scleral icterus. Extraocular muscles intact.  HEENT: Head atraumatic, normocephalic. Oropharynx and nasopharynx clear.  NECK:  Supple, no jugular venous distention. No thyroid enlargement, no tenderness.  LUNGS: Normal breath sounds bilaterally, no wheezing, rales,rhonchi or crepitation. No use of accessory muscles of respiration.  CARDIOVASCULAR: S1, S2 normal. No murmurs, rubs, or gallops.  ABDOMEN: Soft, non-tender, non-distended. Bowel sounds present. No organomegaly or mass.  EXTREMITIES: No pedal edema, cyanosis, or clubbing.  NEUROLOGIC: Cranial nerves II through XII are intact. Muscle strength 5/5 in all extremities. Sensation intact. Gait not checked.  PSYCHIATRIC: The patient is alert and oriented x 3.  SKIN: No obvious rash, lesion, or ulcer.  DATA REVIEW:   CBC Recent Labs  Lab 10/22/18 0429  WBC 8.3  HGB 13.1  HCT 40.8  PLT 230    Chemistries  Recent Labs  Lab 10/23/18 0541  NA 142   K 3.7  CL 111  CO2 26  GLUCOSE 97  BUN 10  CREATININE 0.58  CALCIUM 9.3  MG 2.1  AST 48*  ALT 74*  ALKPHOS 66  BILITOT 0.9     Microbiology Results  Results for orders placed or performed during the hospital encounter of 10/21/18  SARS CORONAVIRUS 2 Nasal Swab Aptima Multi Swab     Status: None   Collection Time: 10/22/18  2:15 AM   Specimen: Aptima Multi Swab; Nasal Swab  Result Value Ref Range Status   SARS Coronavirus 2 NEGATIVE NEGATIVE Final    Comment: (NOTE) SARS-CoV-2 target nucleic acids are NOT DETECTED. The SARS-CoV-2 RNA is generally detectable in upper and lower respiratory specimens during the acute phase of infection. Negative results do not preclude SARS-CoV-2 infection, do not rule out co-infections with other pathogens, and should not be used as the sole basis for treatment or other patient management decisions. Negative results must be combined with clinical observations, patient history, and epidemiological information. The expected result is Negative. Fact Sheet for Patients: SugarRoll.be Fact Sheet for Healthcare Providers: https://www.woods-mathews.com/ This test is not yet approved or cleared by the [place] FDA and  has been authorized for detection and/or diagnosis of SARS-CoV-2 by FDA under an Emergency Use Authorization (EUA). This EUA will remain  in effect (meaning this test can be used) for the duration of the COVID-19 declaration under Section 56 4(b)(1) of the Act,  21 U.S.C. section 360bbb-3(b)(1), unless the authorization is terminated or revoked sooner. Performed at [facility], [place] [address], [place], [place] [address]     RADIOLOGY:  Nm Hepatobiliary Liver Func  Result Date: 10/23/2018 CLINICAL DATA:  Right upper quadrant pain EXAM: NUCLEAR MEDICINE HEPATOBILIARY IMAGING TECHNIQUE: Sequential images of the abdomen were obtained out to 60 minutes following intravenous  administration of radiopharmaceutical. RADIOPHARMACEUTICALS:  5.34 mCi Tc-22m  Choletec IV COMPARISON:  Ultrasound right upper quadrant October 21, 2018 FINDINGS: Prompt uptake and biliary excretion of activity by the liver is seen. Gallbladder activity is visualized, consistent with patency of cystic duct. Biliary activity passes into small bowel, consistent with patent common bile duct. IMPRESSION: Study within normal limits. Electronically Signed   By: Lowella Grip III M.D.   On: 10/23/2018 13:26     Management plans discussed with the patient, family and they are in agreement.  CODE STATUS: Full Code   TOTAL TIME TAKING CARE OF THIS PATIENT: 34 minutes.    Yasemin Rabon M.D on 10/23/2018 at 1:51 PM  Between 7am to 6pm - Pager - [phone]  After 6pm go to [email or web address] - Proofreader  [employer]  Hospitalists  Office  [phone]  CC: Primary care physician; [place]   Note: This dictation was prepared with Dragon dictation along with smaller phrase technology. Any transcriptional errors that result from this process are unintentional.

## 2018-10-24 LAB — HEPATITIS PANEL, ACUTE
HCV Ab: <0.1 s/co ratio (ref 0.0–0.9)
Hep A IgM: NEGATIVE
Hep B C IgM: NEGATIVE
Hepatitis B Surface Ag: NEGATIVE

## 2019-07-06 ENCOUNTER — Emergency Department: Payer: Medicare Other

## 2019-07-06 ENCOUNTER — Inpatient Hospital Stay
Admission: EM | Admit: 2019-07-06 | Discharge: 2019-07-10 | DRG: 511 | Disposition: A | Discharge status: Home Health | Payer: Medicare Other | Attending: Hospitalist | Admitting: Hospitalist

## 2019-07-06 ENCOUNTER — Other Ambulatory Visit: Payer: Self-pay

## 2019-07-06 DIAGNOSIS — Z791 Long term (current) use of non-steroidal anti-inflammatories (NSAID): Secondary | ICD-10-CM

## 2019-07-06 DIAGNOSIS — F419 Anxiety disorder, unspecified: Secondary | ICD-10-CM | POA: Diagnosis present

## 2019-07-06 DIAGNOSIS — S3210XD Unspecified fracture of sacrum, subsequent encounter for fracture with routine healing: Secondary | ICD-10-CM | POA: Diagnosis not present

## 2019-07-06 DIAGNOSIS — S3210XA Unspecified fracture of sacrum, initial encounter for closed fracture: Secondary | ICD-10-CM

## 2019-07-06 DIAGNOSIS — R52 Pain, unspecified: Secondary | ICD-10-CM

## 2019-07-06 DIAGNOSIS — S52602A Unspecified fracture of lower end of left ulna, initial encounter for closed fracture: Secondary | ICD-10-CM | POA: Diagnosis present

## 2019-07-06 DIAGNOSIS — S52532A Colles' fracture of left radius, initial encounter for closed fracture: Principal | ICD-10-CM | POA: Diagnosis present

## 2019-07-06 DIAGNOSIS — Z882 Allergy status to sulfonamides status: Secondary | ICD-10-CM | POA: Diagnosis not present

## 2019-07-06 DIAGNOSIS — S32110A Nondisplaced Zone I fracture of sacrum, initial encounter for closed fracture: Secondary | ICD-10-CM | POA: Diagnosis present

## 2019-07-06 DIAGNOSIS — Z20822 Contact with and (suspected) exposure to covid-19: Secondary | ICD-10-CM | POA: Diagnosis present

## 2019-07-06 DIAGNOSIS — W19XXXD Unspecified fall, subsequent encounter: Secondary | ICD-10-CM | POA: Diagnosis not present

## 2019-07-06 DIAGNOSIS — S62102A Fracture of unspecified carpal bone, left wrist, initial encounter for closed fracture: Secondary | ICD-10-CM

## 2019-07-06 DIAGNOSIS — W1830XA Fall on same level, unspecified, initial encounter: Secondary | ICD-10-CM | POA: Diagnosis present

## 2019-07-06 DIAGNOSIS — Z79899 Other long term (current) drug therapy: Secondary | ICD-10-CM

## 2019-07-06 DIAGNOSIS — Z8781 Personal history of (healed) traumatic fracture: Secondary | ICD-10-CM

## 2019-07-06 DIAGNOSIS — Z419 Encounter for procedure for purposes other than remedying health state, unspecified: Secondary | ICD-10-CM

## 2019-07-06 DIAGNOSIS — W19XXXA Unspecified fall, initial encounter: Secondary | ICD-10-CM

## 2019-07-06 LAB — CBC WITH DIFFERENTIAL/PLATELET
Abs Immature Granulocytes: 0.1 10*3/uL — ABNORMAL HIGH (ref 0.00–0.07)
Basophils Absolute: 0 10*3/uL (ref 0.0–0.1)
Basophils Relative: 0 %
Eosinophils Absolute: 0 10*3/uL (ref 0.0–0.5)
Eosinophils Relative: 0 %
HCT: 42.4 % (ref 36.0–46.0)
Hemoglobin: 14 g/dL (ref 12.0–15.0)
Immature Granulocytes: 1 %
Lymphocytes Relative: 4 %
Lymphs Abs: 0.7 10*3/uL (ref 0.7–4.0)
MCH: 30.2 pg (ref 26.0–34.0)
MCHC: 33 g/dL (ref 30.0–36.0)
MCV: 91.6 fL (ref 80.0–100.0)
Monocytes Absolute: 1 10*3/uL (ref 0.1–1.0)
Monocytes Relative: 7 %
Neutro Abs: 12.9 10*3/uL — ABNORMAL HIGH (ref 1.7–7.7)
Neutrophils Relative %: 88 %
Platelets: 234 10*3/uL (ref 150–400)
RBC: 4.63 MIL/uL (ref 3.87–5.11)
RDW: 13.4 % (ref 11.5–15.5)
WBC: 14.7 10*3/uL — ABNORMAL HIGH (ref 4.0–10.5)
nRBC: 0 % (ref 0.0–0.2)

## 2019-07-06 LAB — BASIC METABOLIC PANEL
Anion gap: 10 (ref 5–15)
BUN: 18 mg/dL (ref 8–23)
CO2: 23 mmol/L (ref 22–32)
Calcium: 9.6 mg/dL (ref 8.9–10.3)
Chloride: 104 mmol/L (ref 98–111)
Creatinine, Ser: 1 mg/dL (ref 0.44–1.00)
GFR calc Af Amer: >60 mL/min (ref >60)
GFR calc non Af Amer: 52 mL/min — ABNORMAL LOW (ref >60)
Glucose, Bld: 130 mg/dL — ABNORMAL HIGH (ref 70–99)
Potassium: 3.9 mmol/L (ref 3.5–5.1)
Sodium: 137 mmol/L (ref 135–145)

## 2019-07-06 LAB — SARS CORONAVIRUS 2 (TAT 6-24 HRS): SARS Coronavirus 2: NEGATIVE

## 2019-07-06 MED ORDER — ONDANSETRON HCL 4 MG PO TABS: 4.0000 mg | ORAL_TABLET | Freq: Four times a day (QID) | ORAL | Status: DC | PRN

## 2019-07-06 MED ORDER — ENOXAPARIN SODIUM 40 MG/0.4ML ~~LOC~~ SOLN
40.0000 mg | SUBCUTANEOUS | Status: DC
  Administered 2019-07-06 – 2019-07-07 (×2): 40 mg via SUBCUTANEOUS
  Filled 2019-07-06 (×2): qty 0.4

## 2019-07-06 MED ORDER — MESALAMINE 1.2 G PO TBEC
1.2000 g | DELAYED_RELEASE_TABLET | Freq: Every day | ORAL | Status: DC
  Administered 2019-07-06 – 2019-07-10 (×4): 1.2 g via ORAL
  Filled 2019-07-06 (×5): qty 1

## 2019-07-06 MED ORDER — ONDANSETRON HCL 4 MG/2ML IJ SOLN: 4.0000 mg | Freq: Four times a day (QID) | INTRAMUSCULAR | Status: DC | PRN

## 2019-07-06 MED ORDER — MORPHINE SULFATE (PF) 2 MG/ML IV SOLN: 2.0000 mg | INTRAVENOUS | Status: DC | PRN

## 2019-07-06 MED ORDER — METHOCARBAMOL 500 MG PO TABS
500.0000 mg | ORAL_TABLET | Freq: Three times a day (TID) | ORAL | Status: DC
  Administered 2019-07-06 – 2019-07-10 (×11): 500 mg via ORAL
  Filled 2019-07-06 (×13): qty 1

## 2019-07-06 MED ORDER — ONDANSETRON HCL 4 MG/2ML IJ SOLN
4.0000 mg | Freq: Once | INTRAMUSCULAR | Status: AC
  Administered 2019-07-06: 05:00:00 4 mg via INTRAVENOUS
  Filled 2019-07-06: qty 2

## 2019-07-06 MED ORDER — OXYBUTYNIN CHLORIDE 5 MG PO TABS
5.0000 mg | ORAL_TABLET | Freq: Two times a day (BID) | ORAL | Status: DC
  Administered 2019-07-06 – 2019-07-09 (×5): 5 mg via ORAL
  Filled 2019-07-06 (×10): qty 1

## 2019-07-06 MED ORDER — FENTANYL CITRATE (PF) 100 MCG/2ML IJ SOLN
50.0000 ug | Freq: Once | INTRAMUSCULAR | Status: AC
  Administered 2019-07-06: 04:00:00 50 ug via INTRAVENOUS
  Filled 2019-07-06: qty 2

## 2019-07-06 MED ORDER — CITALOPRAM HYDROBROMIDE 20 MG PO TABS
40.0000 mg | ORAL_TABLET | Freq: Every day | ORAL | Status: DC
  Administered 2019-07-06 – 2019-07-10 (×4): 40 mg via ORAL
  Filled 2019-07-06 (×4): qty 2

## 2019-07-06 MED ORDER — ACETAMINOPHEN 650 MG RE SUPP: 650.0000 mg | Freq: Four times a day (QID) | RECTAL | Status: DC | PRN

## 2019-07-06 MED ORDER — VALACYCLOVIR HCL 500 MG PO TABS
500.0000 mg | ORAL_TABLET | Freq: Every day | ORAL | Status: DC
  Administered 2019-07-06 – 2019-07-10 (×4): 500 mg via ORAL
  Filled 2019-07-06 (×5): qty 1

## 2019-07-06 MED ORDER — MORPHINE SULFATE (PF) 2 MG/ML IV SOLN
2.0000 mg | Freq: Once | INTRAVENOUS | Status: AC
  Administered 2019-07-06: 05:00:00 2 mg via INTRAVENOUS
  Filled 2019-07-06: qty 1

## 2019-07-06 MED ORDER — VITAMIN D 25 MCG (1000 UNIT) PO TABS
5000.0000 [IU] | ORAL_TABLET | Freq: Every day | ORAL | Status: DC
  Administered 2019-07-06 – 2019-07-10 (×4): 5000 [IU] via ORAL
  Filled 2019-07-06 (×5): qty 5

## 2019-07-06 MED ORDER — ACETAMINOPHEN 325 MG PO TABS
650.0000 mg | ORAL_TABLET | Freq: Four times a day (QID) | ORAL | Status: DC | PRN
  Administered 2019-07-06 – 2019-07-10 (×6): 650 mg via ORAL
  Filled 2019-07-06 (×6): qty 2

## 2019-07-06 NOTE — ED Notes (Signed)
This RN to bedside at this time, introduced self to patient. Pt's daughter at bedside. Updated and explained plan of care to patient. Pt and daughter state understanding. Denies any needs. Call bell placed within reach of patient. Pt remains on 2L via [place] at this time.

## 2019-07-06 NOTE — Consult Note (Signed)
Reason for Consult: Left distal radius and ulna fracture along with sacral fractures Referring Physician: Dr. Jaquita Rector is an 85 y.o. female.  HPI: Patient suffered a fall last evening at home landing on both arms.  She had an abrasion to the right elbow and suffered a displaced Colles' fracture.  Additionally she complained of severe hip pain CT of the spine and hip were obtained that shows sacral fractures.  This consistent with a sacral insufficiency fracture.  She is unable to bear weight and has no history of prior back or hip symptoms.  She does have bilateral knee osteoarthritis.  She denies loss of consciousness.  Denies loss of bowel or bladder function or any numbness or tingling down the legs.  No numbness to the hand.  She is normally independent ambulator and lives alone.  Past Medical History:  Diagnosis Date  . Anxiety     History reviewed. No pertinent surgical history.  History reviewed. No pertinent family history.  Social History:  reports that she has never smoked. She has never used smokeless tobacco. She reports previous alcohol use. She reports previous drug use.  Allergies:  Allergies  Allergen Reactions  . Sulfa Antibiotics Swelling    Medications: I have reviewed the patient's current medications.  Results for orders placed or performed during the hospital encounter of 07/06/19 (from the past 48 hour(s))  Basic metabolic panel     Status: Abnormal   Collection Time: 07/06/19  3:51 AM  Result Value Ref Range   Sodium 137 135 - 145 mmol/L   Potassium 3.9 3.5 - 5.1 mmol/L   Chloride 104 98 - 111 mmol/L   CO2 23 22 - 32 mmol/L   Glucose, Bld 130 (H) 70 - 99 mg/dL    Comment: Glucose reference range applies only to samples taken after fasting for at least 8 hours.   BUN 18 8 - 23 mg/dL   Creatinine, Ser 1.00 0.44 - 1.00 mg/dL   Calcium 9.6 8.9 - 10.3 mg/dL   GFR calc non Af Amer 52 (L) >60 mL/min   GFR calc Af Amer >60 >60 mL/min   Anion gap 10  5 - 15    Comment: Performed at [facility], [place] [place], [place] [address]  CBC with Differential     Status: Abnormal   Collection Time: 07/06/19  3:51 AM  Result Value Ref Range   WBC 14.7 (H) 4.0 - 10.5 K/uL   RBC 4.63 3.87 - 5.11 MIL/uL   Hemoglobin 14.0 12.0 - 15.0 g/dL   HCT 42.4 36.0 - 46.0 %   MCV 91.6 80.0 - 100.0 fL   MCH 30.2 26.0 - 34.0 pg   MCHC 33.0 30.0 - 36.0 g/dL   RDW 13.4 11.5 - 15.5 %   Platelets 234 150 - 400 K/uL   nRBC 0.0 0.0 - 0.2 %   Neutrophils Relative % 88 %   Neutro Abs 12.9 (H) 1.7 - 7.7 K/uL   Lymphocytes Relative 4 %   Lymphs Abs 0.7 0.7 - 4.0 K/uL   Monocytes Relative 7 %   Monocytes Absolute 1.0 0.1 - 1.0 K/uL   Eosinophils Relative 0 %   Eosinophils Absolute 0.0 0.0 - 0.5 K/uL   Basophils Relative 0 %   Basophils Absolute 0.0 0.0 - 0.1 K/uL   Immature Granulocytes 1 %   Abs Immature Granulocytes 0.10 (H) 0.00 - 0.07 K/uL    Comment: Performed at [facility], [place]  [address], [place], [place] [address]    DG Elbow Complete Right  Result Date: 07/06/2019 CLINICAL DATA:  Left wrist deformity with skin tear. EXAM: RIGHT ELBOW - COMPLETE 3+ VIEW COMPARISON:  None. FINDINGS: There is no evidence of fracture, dislocation, or joint effusion. No opaque foreign body. Generalized osteopenic appearance IMPRESSION: No acute finding. Electronically Signed   By: Monte Fantasia M.D.   On: 07/06/2019 04:41   DG Wrist Complete Left  Result Date: 07/06/2019 CLINICAL DATA:  Fall with wrist deformity EXAM: LEFT WRIST - COMPLETE 3+ VIEW COMPARISON:  None. FINDINGS: Acute primarily transverse fracture through the distal radial metaphysis with mainly medial sided impaction. There is a branching fracture line extending longitudinally into the proximal diaphysis. Acute fracture at the metaphysis and styloid process of the ulna, nondisplaced. Regional soft tissue swelling. A true lateral view could not be obtained, there is gross  alignment at the wrist joint. Advanced first [place] and STT osteoarthritis. IMPRESSION: Distal radius and ulnar fractures with radial impaction. Electronically Signed   By: Monte Fantasia M.D.   On: 07/06/2019 04:42   CT Head Wo Contrast  Result Date: 07/06/2019 CLINICAL DATA:  Fall with wrist fracture. EXAM: CT HEAD WITHOUT CONTRAST TECHNIQUE: Contiguous axial images were obtained from the base of the skull through the vertex without intravenous contrast. COMPARISON:  None. FINDINGS: Brain: No evidence of acute infarction, hemorrhage, hydrocephalus, extra-axial collection or mass lesion/mass effect. Chronic small vessel ischemia in the deep cerebral white matter with lacunar infarct in the right thalamus that is chronic based on history. Vascular: Atherosclerotic calcification. Skull: Negative for fracture Sinuses/Orbits: No evidence of injury IMPRESSION: No evidence of intracranial injury. Electronically Signed   By: Monte Fantasia M.D.   On: 07/06/2019 04:51   CT Lumbar Spine Wo Contrast  Result Date: 07/06/2019 CLINICAL DATA:  Fall with left hip and lower back pain EXAM: CT LUMBAR SPINE WITHOUT CONTRAST TECHNIQUE: Multidetector CT imaging of the lumbar spine was performed without intravenous contrast administration. Multiplanar CT image reconstructions were also generated. COMPARISON:  None. FINDINGS: Segmentation: 5 lumbar type vertebrae based on the lowest ribs. Alignment: Slight anterolisthesis at L5-S1 Vertebrae: Small cortex fracture involving the lateral left sacral ala. No displacement or sacral foramina involvement. No vertebral fracture noted. Osteopenia. Paraspinal and other soft tissues: Diffuse atherosclerotic plaque. No acute soft tissue finding. Disc levels: Aside from L5-S1 there is relatively preserved disc height. Generalized degenerative facet spurring. There is up to moderate spinal stenosis at L3-4. IMPRESSION: Acute, nondisplaced left sacral ala fracture. Electronically Signed   By:  Monte Fantasia M.D.   On: 07/06/2019 06:12   CT Hip Left Wo Contrast  Result Date: 07/06/2019 CLINICAL DATA:  85 year old female with history of trauma from a fall complaining of left hip and low back pain. EXAM: CT OF THE LEFT HIP WITHOUT CONTRAST TECHNIQUE: Multidetector CT imaging of the left hip was performed according to the standard protocol. Multiplanar CT image reconstructions were also generated. COMPARISON:  No priors. FINDINGS: Bones/Joint/Cartilage No acute displaced fracture noted. Left femoral head is located. There is joint space narrowing, subchondral sclerosis, subchondral cyst formation and osteophyte formation in the left hip joint, compatible with moderate osteoarthritis. Ligaments Suboptimally assessed by CT. Muscles and Tendons No acute abnormality noted. Soft tissues No unexpected fluid collection or inflammatory changes. Atherosclerotic calcification in the visualized vasculature. IMPRESSION: 1. No acute radiographic abnormality of the left hip. 2. Moderate left hip joint osteoarthritis. 3. Atherosclerosis. Electronically Signed   By: Vinnie Langton  M.D.   On: 07/06/2019 05:56   DG Hip Unilat With Pelvis 2-3 Views Left  Result Date: 07/06/2019 CLINICAL DATA:  Fall with left hip pain EXAM: DG HIP (WITH OR WITHOUT PELVIS) 2-3V LEFT COMPARISON:  None. FINDINGS: No evidence of hip or pelvic ring fracture. Both hips are located. Generalized osteopenia. Atherosclerotic calcification IMPRESSION: No acute finding. Electronically Signed   By: Monte Fantasia M.D.   On: 07/06/2019 04:40    Review of Systems Blood pressure (!) 148/90, pulse 89, temperature 98.1 F (36.7 C), temperature source Oral, resp. rate 20, height 5\' 3"  (1.6 m), weight 62.6 kg, SpO2 95 %. Physical Exam  Sensation is intact to her fingers of the left hand with a long-arm splint applied.  She has brisk capillary refill.  She is able flex extend the fingers.. She is tender at her sacrum left worse than right with  exquisite tenderness. Radiology review shows the sacral fracture as well as a comminuted distal radius fracture with significant dorsal displacement and angulation.  Assessment/Plan: 1 sacral fracture with severe pain.  Recommendation is a trial therapy over the weekend and if she does not have significant improvement in ability to ambulate would recommend sacral plasty Monday afternoon 2.  Left distal radius fracture displaced.  Recommendation is continue splint through the weekend if she is able to ambulate and sacral fracture improves would recommend outpatient surgery late at a later date..  If she is unable to ambulate with a platform walker and into the sacral fracture and is undergo sacral insufficiency repair I would recommend ORIF at the same operative time.  We will continue to follow her through the weekend and see how she does with physical therapy.  Hessie Knows 07/06/2019, 8:02 AM

## 2019-07-06 NOTE — ED Notes (Signed)
This RN and Buford Dresser, Medic Student to bedside, pt denies any needs at this time. Pt resting comfortably in bed at this time. Call bell within reach. Instructed patient that if she needs anything to please use call bell. Pt denies pain at this time. VSS. Pt remains on 2L via [place] at this time.

## 2019-07-06 NOTE — ED Provider Notes (Signed)
[facility] Emergency Department Provider Note  ____________________________________________   First MD Initiated Contact with Patient 07/06/19 0320     (approximate)  I have reviewed the triage vital signs and the nursing notes.  History  Chief Complaint Fall    HPI Laura Morrow is a 85 y.o. female past medical history as below who presents to emergency department for a fall.  Patient states she is not exactly sure why she fell, she thinks she lost balance when she was putting her pajama bottoms on.  States she fell onto her left side.  Reports resultant pain to the left wrist where she has an obvious deformity, as well as the left hip.  She states when she tried to bear weight her entire left leg was painful and radiated into her groin.  She has obvious swelling and deformity to the left wrist.  She denies any preceding chest pain, palpitations, lightheadedness, dizziness.  Denies any head injury or loss of consciousness.  Pain is sharp/shooting.  5/10 in severity.  Located to the left wrist and left hip.  No radiation.  Worsened with movement.  Improved with rest.  Not on any blood thinning medications.   Past Medical Hx Past Medical History:  Diagnosis Date  . Anxiety     Problem List Patient Active Problem List   Diagnosis Date Noted  . Cholecystitis 10/22/2018    Past Surgical Hx History reviewed. No pertinent surgical history.  Medications Prior to Admission medications   Medication Sig Start Date End Date Taking? Authorizing Provider  Cholecalciferol (VITAMIN D3) 125 MCG (5000 UT) CAPS Take 5,000 Units by mouth daily.   Yes [provider]  citalopram (CELEXA) 40 MG tablet Take 1 tablet by mouth daily. 07/21/18  Yes [provider]  Cranberry Extract 250 MG TABS Take 500 mg by mouth daily.   Yes [provider]  mesalamine (LIALDA) 1.2 g EC tablet Take 1.2 g by mouth daily. 02/19/19  Yes [provider]    naproxen sodium (ALEVE) 220 MG tablet Take 220 mg by mouth 2 (two) times daily as needed.   Yes [provider]  oxybutynin (DITROPAN) 5 MG tablet Take 5 mg by mouth 2 (two) times daily. 01/05/19 01/05/20 Yes [provider]  valACYclovir (VALTREX) 500 MG tablet Take 500 mg by mouth daily. 07/21/18  Yes [provider]    Allergies Sulfa antibiotics  Family Hx History reviewed. No pertinent family history.  Social Hx Social History   Tobacco Use  . Smoking status: Never Smoker  . Smokeless tobacco: Never Used  Substance Use Topics  . Alcohol use: Not Currently  . Drug use: Not Currently     Review of Systems  Constitutional: Negative for fever. Negative for chills.  Positive for fall. Eyes: Negative for visual changes. ENT: Negative for sore throat. Cardiovascular: Negative for chest pain. Respiratory: Negative for shortness of breath. Gastrointestinal: Negative for nausea. Negative for vomiting.  Genitourinary: Negative for dysuria. Musculoskeletal: Positive for LEFT wrist and hip pain. Skin: Negative for rash. Neurological: Negative for headaches.   Physical Exam  Vital Signs: ED Triage Vitals  Enc Vitals Group     BP 07/06/19 0315 (!) 172/93     Pulse Rate 07/06/19 0315 91     Resp 07/06/19 0315 20     Temp 07/06/19 0315 98.1 F (36.7 C)     Temp Source 07/06/19 0315 Oral     SpO2 07/06/19 0315 94 %  Weight 07/06/19 0316 138 lb (62.6 kg)     Height 07/06/19 0316 5\' 3"  (1.6 m)     Head Circumference --      Peak Flow --      Pain Score 07/06/19 0315 5     Pain Loc --      Pain Edu? --      Excl. in [place]? --     Constitutional: Alert and oriented. Well appearing. NAD.  Head: Normocephalic. Atraumatic. Eyes: Conjunctivae clear. Sclera anicteric. Pupils equal and symmetric. Nose: No masses or lesions. No congestion or rhinorrhea. Mouth/Throat: Wearing mask.  Neck: No stridor. Trachea midline.  No midline CS  tenderness. Cardiovascular: Normal rate, regular rhythm. Extremities well perfused. Chest: Chest wall stable, nontender.  No flail chest, no crepitance. Respiratory: Normal respiratory effort.  Lungs CTAB. Gastrointestinal: Soft. Non-distended. Non-tender.  Pelvis: Stable and seemingly nontender with AP and lateral compression.  Bilateral hips none tender with gentle logroll.  SLR on left does reproduce and elicit pain. Genitourinary: Deferred. Musculoskeletal:  LUE: Obvious swelling, ecchymosis, deformity to the distal left wrist.  2+ radial pulse.  Able to wiggle fingers.  Sensation intact in radial, median, ulnar distribution.  No lacerations or abrasions. LLE: No obvious deformity.  Able to range at the ankle, knee, and hip, though straight leg raise on the left does reproduce pain in the hip.  Nontender to palpation at the hip, along the femur, about the knee, along the tib/fib, and about the ankle. Neurologic:  Normal speech and language. No gross focal or lateralizing neurologic deficits are appreciated.  Skin: Skin tear to the right elbow.  Ecchymosis about the distal left wrist. Psychiatric: Mood and affect are appropriate for situation.  EKG  Personally reviewed and interpreted by myself.   Date: 07/06/19 Time: 0646 Rate: 94 Rhythm: sinus Axis: normal Intervals: QTc 498 ms No STEMI    Radiology  Personally reviewed available imaging myself.   CT head IMPRESSION:  No evidence of intracranial injury.   XR L hip  IMPRESSION:  No acute finding.   XR R elbow IMPRESSION:  No acute finding.   XR L wrist IMPRESSION:  Distal radius and ulnar fractures with radial impaction.   CT hip IMPRESSION:  1. No acute radiographic abnormality of the left hip.  2. Moderate left hip joint osteoarthritis.  3. Atherosclerosis.   CT L spine IMPRESSION:  Acute, nondisplaced left sacral ala fracture.    Procedures  Procedure(s) performed (including critical  care):  Procedures   Initial Impression / Assessment and Plan / MDM / ED Course  85 y.o. female who presents to the ED for a fall, as above. Pain to the left wrist and about the left hip.  Ddx: fracture, contusion, bruising  Will plan for labs, imaging, pain control  Work-up reveals a left distal radius and ulnar fracture, will place in a sugar tong splint.  This is closed and distally neurovascularly intact.  Also reveals a nondisplaced left sacral ala fracture.  She does report persistent pain in inability to ambulate, as such we will plan for admission for pain control and PT/OT.  We will FYI orthopedics about her admission given her injuries.   _______________________________   As part of my medical decision making I have reviewed available labs, radiology tests, reviewed old records/chart review, obtained additional history from family, and discussed with consultants (orthopedics, Dr. Rudene Christians).     Final Clinical Impression(s) / ED Diagnosis  Final diagnoses:  Fall, initial encounter  Closed  fracture of left wrist, initial encounter  Closed fracture of sacrum, unspecified portion of sacrum, initial encounter [facility])       Note:  This document was prepared using Dragon voice recognition software and may include unintentional dictation errors.   Lilia Pro., MD 07/06/19 [phone]

## 2019-07-06 NOTE — Evaluation (Signed)
Physical Therapy Evaluation Patient Details Name: Laura Morrow MRN: [id number] DOB: 20-Aug-1934 Today's Date: 07/06/2019   History of Present Illness  Laura Morrow is an 41yoF who sustained a fall at home, not s/p Left Colles fracture (preoperative) and Left sacral ala fracture (preoperative). PTA pt lived alone, AMB limited community distances without assist, some recent bila tknee pain with longer distance AMB.  Clinical Impression  Pt admitted with above diagnosis. Pt currently with functional limitations due to the deficits listed below (see "PT Problem List"). Upon entry, pt in bed, awake and agreeable to participate but concerned about pain and safety of mobilizing. The pt is alert and oriented x4, pleasant, conversational, and generally a good historian. Pt reports zero confidence in ability to stand at this time. LUE has been splinted from MCP line to posterior olecranon dorsal and volar without any splint posteriorly. Elbow screened for weight bearing tolerance, pt tolerated GHJ inferior to superior loading without any pain. Pt tolerates short arc ROM of hip in supine 25-30 degrees of flexion/abduction, but this results in intermittent extreme charlie horse action which is arresting to activity. Similar happens when Supine to EOB is attempted with +2 total assist. RN made aware that antispasmodic may facilitate improved tolerance with basic mobility, unclear if this has been ordered. Platform RW in room, but never able to get this far. Functional mobility assessment demonstrates increased effort/time requirements, zero tolerance of basic mobility, and absolute need for physical assistance, whereas the patient performed these with full independence PTA. Pt will benefit from skilled PT intervention to increase independence and safety with basic mobility in preparation for discharge to the venue listed below.       Follow Up Recommendations Supervision for mobility/OOB;Supervision/Assistance -  24 hour;CIR    Equipment Recommendations  Other (comment)(to be determined once surgical path is determined)    Recommendations for Other Services       Precautions / Restrictions Precautions Precautions: Fall Restrictions Weight Bearing Restrictions: Yes LUE Weight Bearing: Weight bear through elbow only(assumptive) LLE Weight Bearing: Weight bearing as tolerated(assumptive)      Mobility  Bed Mobility Overal bed mobility: Needs Assistance Bed Mobility: Supine to Sit     Supine to sit: +2 for physical assistance;Total assist     General bed mobility comments: attempted, authoer provideing bilat popliteal support and bilat scapular support, VC for RUE self-pull holding DTR's hand. Pt arrested by intermittent muslce cramps of left adductors/hamstrings.  Transfers                    Ambulation/Gait                Stairs            Wheelchair Mobility    Modified Rankin (Stroke Patients Only)       Balance                                             Pertinent Vitals/Pain Pain Assessment: 0-10 Pain Score: 5  Pain Location: Left wrist; Left pelvis/belly; Pain Descriptors / Indicators: Aching Pain Intervention(s): Limited activity within patient's tolerance;Monitored during session    Home Living Family/patient expects to be discharged to:: Private residence Living Arrangements: Alone Available Help at Discharge: Family(DTR and SIL live 1 miles away) Type of Home: House Home Access: Level entry     Home Layout: One level  Home Equipment: Wheelchair - manual;Walker - 4 wheels Additional Comments: available from DTR if needed.    Prior Function Level of Independence: Independent(broke other wrist 5ya during a fall.)         Comments: still drives, makes groceries; has limiting bilat knee pain.     Hand Dominance   Dominant Hand: Right    Extremity/Trunk Assessment                Communication       Cognition Arousal/Alertness: Awake/alert Behavior During Therapy: WFL for tasks assessed/performed Overall Cognitive Status: Within Functional Limits for tasks assessed                                        General Comments      Exercises General Exercises - Lower Extremity Heel Slides: AAROM;Left;5 reps;Supine;AROM;Limitations Heel Slides Limitations: partial rangte tolerated irrespective of charlie horse issues Hip ABduction/ADduction: AAROM;Left;5 reps;Supine;Limitations Hip Abduction/Adduction Limitations: partial rangte tolerated irrespective of charlie horse issues   Assessment/Plan    PT Assessment Patient needs continued PT services  PT Problem List Decreased strength;Decreased range of motion;Decreased activity tolerance;Decreased mobility;Decreased knowledge of precautions;Decreased safety awareness       PT Treatment Interventions DME instruction;Gait training;Functional mobility training;Stair training;Therapeutic activities;Therapeutic exercise;Wheelchair mobility training;Balance training;Patient/family education    PT Goals (Current goals can be found in the Care Plan section)  Acute Rehab PT Goals Patient Stated Goal: resolve pain, return to home AMB PT Goal Formulation: With patient Time For Goal Achievement: 07/20/19 Potential to Achieve Goals: Poor    Frequency 7X/week   Barriers to discharge Decreased caregiver support;Inaccessible home environment      Co-evaluation               AM-PAC PT "6 Clicks" Mobility  Outcome Measure Help needed turning from your back to your side while in a flat bed without using bedrails?: Total Help needed moving from lying on your back to sitting on the side of a flat bed without using bedrails?: Total Help needed moving to and from a bed to a chair (including a wheelchair)?: Total Help needed standing up from a chair using your arms (e.g., wheelchair or bedside chair)?: Total Help needed to walk  in hospital room?: Total Help needed climbing 3-5 steps with a railing? : Total 6 Click Score: 6    End of Session   Activity Tolerance: Patient limited by pain Patient left: in bed;with family/visitor present Nurse Communication: Patient requests pain meds PT Visit Diagnosis: Unsteadiness on feet (R26.81);Other abnormalities of gait and mobility (R26.89);Difficulty in walking, not elsewhere classified (R26.2);History of falling (Z91.81);Muscle weakness (generalized) (M62.81)    Time: [id number] PT Time Calculation (min) (ACUTE ONLY): 19 min   Charges:   PT Evaluation $PT Eval Low Complexity: 1 Low         4:19 PM, 07/06/19 Etta Grandchild, PT, DPT Physical Therapist - [facility]  [phone] ([place])     Jennel Mara C 07/06/2019, 4:10 PM

## 2019-07-06 NOTE — Progress Notes (Signed)
OT Cancellation Note  Patient Details Name: Laura Morrow MRN: [id number] DOB: [birth year]   Cancelled Treatment:    Reason Eval/Treat Not Completed: Patient at procedure or test/ unavailable. OT order received and chart reviewed. Pt currently working c PT, will follow up and intiate services as available.   Dessie Coma, M.S. OTR/L  07/06/19, 3:42 PM

## 2019-07-06 NOTE — ED Notes (Signed)
Pt presents with obvious deformity to left wrist, skin tear approx 6 cm to right elbow, and pain to left hip. Pt reports she cannot put weight on her left leg, and that the hip pain radiates across her lower abdomen

## 2019-07-06 NOTE — H&P (Signed)
History and Physical    Laura Morrow [id number] DOB: 28-Mar-1934 DOA: 07/06/2019  PCP: [facility]   Patient coming from: Home  I have personally briefly reviewed patient's old medical records in [place]  Chief Complaint: Status post fall  HPI: Laura Morrow is a 85 y.o. female with medical history significant for anxiety who presented to the emergency department for evaluation of that she had a fall.  Patient states that she lost balance while putting her pajama bottoms on and fell to her left side.  She noted pain involving her left wrist as well as left hip.  She attempted to bear weight but was unable to due to severe pain in her left leg that radiated into her groin.  She has obvious swelling and deformity involving the left wrist.  She denies any preceding lightheadedness, dizziness, chest pain or palpitations.  She denies any loss of consciousness.  She rates the pain in her left wrist and left hip about a 6 x 10 in intensity at its worst and worsened with movement but improved at rest.   ED Course: Patient is seen in the ER for evaluation of left wrist and left hip pain. Work-up revealed a left distal radius and ulnar fracture, which is closed and distally neurovascularly intact.  Also reveals a nondisplaced left sacral ala fracture.  She does report persistent pain in inability to ambulate, as such we will plan for admission for pain control and PT/OT. Orthopedic consult was requested   Review of Systems: As per HPI otherwise 10 point review of systems negative.    Past Medical History:  Diagnosis Date   Anxiety     History reviewed. No pertinent surgical history.   reports that she has never smoked. She has never used smokeless tobacco. She reports previous alcohol use. She reports previous drug use.  Allergies  Allergen Reactions   Sulfa Antibiotics Swelling    History reviewed. No pertinent family history.   Prior to Admission medications    Medication Sig Start Date End Date Taking? Authorizing Provider  Cholecalciferol (VITAMIN D3) 125 MCG (5000 UT) CAPS Take 5,000 Units by mouth daily.   Yes [provider]  citalopram (CELEXA) 40 MG tablet Take 1 tablet by mouth daily. 07/21/18  Yes [provider]  Cranberry Extract 250 MG TABS Take 500 mg by mouth daily.   Yes [provider]  mesalamine (LIALDA) 1.2 g EC tablet Take 1.2 g by mouth daily. 02/19/19  Yes [provider]  naproxen sodium (ALEVE) 220 MG tablet Take 220 mg by mouth 2 (two) times daily as needed.   Yes [provider]  oxybutynin (DITROPAN) 5 MG tablet Take 5 mg by mouth 2 (two) times daily. 01/05/19 01/05/20 Yes [provider]  valACYclovir (VALTREX) 500 MG tablet Take 500 mg by mouth daily. 07/21/18  Yes [provider]    Physical Exam: Vitals:   07/06/19 0700 07/06/19 0730 07/06/19 0800 07/06/19 0830  BP: (!) 156/96 (!) 144/85 (!) 147/90 118/72  Pulse: 91 82 90 74  Resp:      Temp:      TempSrc:      SpO2: 97% 96% 97% 96%  Weight:      Height:         Vitals:   07/06/19 0700 07/06/19 0730 07/06/19 0800 07/06/19 0830  BP: (!) 156/96 (!) 144/85 (!) 147/90 118/72  Pulse: 91 82 90 74  Resp:      Temp:  TempSrc:      SpO2: 97% 96% 97% 96%  Weight:      Height:        Constitutional: NAD, alert and oriented x 3, apparent pain. Eyes: PERRL, lids and conjunctivae normal ENMT: Mucous membranes are moist.  Neck: normal, supple, no masses, no thyromegaly Respiratory: clear to auscultation bilaterally, no wheezing, no crackles. Normal respiratory effort. No accessory muscle use.  Cardiovascular: Regular rate and rhythm, no murmurs / rubs / gallops. No extremity edema. 2+ pedal pulses. No carotid bruits.  Abdomen: no tenderness, no masses palpated. No hepatosplenomegaly. Bowel sounds positive.  Musculoskeletal: no clubbing / cyanosis. No joint deformity upper and lower extremities.  Left  forearm in cast Skin: no rashes, lesions, ulcers.  Neurologic: No gross focal neurologic deficit. Psychiatric: Normal mood and affect.   Labs on Admission: I have personally reviewed following labs and imaging studies  CBC: Recent Labs  Lab 07/06/19 0351  WBC 14.7*  NEUTROABS 12.9*  HGB 14.0  HCT 42.4  MCV 91.6  PLT [id number]   Basic Metabolic Panel: Recent Labs  Lab 07/06/19 0351  NA 137  K 3.9  CL 104  CO2 23  GLUCOSE 130*  BUN 18  CREATININE 1.00  CALCIUM 9.6   GFR: Estimated Creatinine Clearance: 35.9 mL/min (by C-G formula based on SCr of 1 mg/dL). Liver Function Tests: No results for input(s): AST, ALT, ALKPHOS, BILITOT, PROT, ALBUMIN in the last 168 hours. No results for input(s): LIPASE, AMYLASE in the last 168 hours. No results for input(s): AMMONIA in the last 168 hours. Coagulation Profile: No results for input(s): INR, PROTIME in the last 168 hours. Cardiac Enzymes: No results for input(s): CKTOTAL, CKMB, CKMBINDEX, TROPONINI in the last 168 hours. BNP (last 3 results) No results for input(s): PROBNP in the last 8760 hours. HbA1C: No results for input(s): HGBA1C in the last 72 hours. CBG: No results for input(s): GLUCAP in the last 168 hours. Lipid Profile: No results for input(s): CHOL, HDL, LDLCALC, TRIG, CHOLHDL, LDLDIRECT in the last 72 hours. Thyroid Function Tests: No results for input(s): TSH, T4TOTAL, FREET4, T3FREE, THYROIDAB in the last 72 hours. Anemia Panel: No results for input(s): VITAMINB12, FOLATE, FERRITIN, TIBC, IRON, RETICCTPCT in the last 72 hours. Urine analysis: No results found for: COLORURINE, APPEARANCEUR, [place], [place], GLUCOSEU, HGBUR, BILIRUBINUR, KETONESUR, PROTEINUR, UROBILINOGEN, NITRITE, LEUKOCYTESUR  Radiological Exams on Admission: DG Elbow Complete Right  Result Date: 07/06/2019 CLINICAL DATA:  Left wrist deformity with skin tear. EXAM: RIGHT ELBOW - COMPLETE 3+ VIEW COMPARISON:  None. FINDINGS: There is no  evidence of fracture, dislocation, or joint effusion. No opaque foreign body. Generalized osteopenic appearance IMPRESSION: No acute finding. Electronically Signed   By: Monte Fantasia M.D.   On: 07/06/2019 04:41   DG Wrist Complete Left  Result Date: 07/06/2019 CLINICAL DATA:  Fall with wrist deformity EXAM: LEFT WRIST - COMPLETE 3+ VIEW COMPARISON:  None. FINDINGS: Acute primarily transverse fracture through the distal radial metaphysis with mainly medial sided impaction. There is a branching fracture line extending longitudinally into the proximal diaphysis. Acute fracture at the metaphysis and styloid process of the ulna, nondisplaced. Regional soft tissue swelling. A true lateral view could not be obtained, there is gross alignment at the wrist joint. Advanced first [place] and STT osteoarthritis. IMPRESSION: Distal radius and ulnar fractures with radial impaction. Electronically Signed   By: Monte Fantasia M.D.   On: 07/06/2019 04:42   CT Head Wo Contrast  Result Date: 07/06/2019 CLINICAL DATA:  Fall with wrist fracture. EXAM: CT HEAD WITHOUT CONTRAST TECHNIQUE: Contiguous axial images were obtained from the base of the skull through the vertex without intravenous contrast. COMPARISON:  None. FINDINGS: Brain: No evidence of acute infarction, hemorrhage, hydrocephalus, extra-axial collection or mass lesion/mass effect. Chronic small vessel ischemia in the deep cerebral white matter with lacunar infarct in the right thalamus that is chronic based on history. Vascular: Atherosclerotic calcification. Skull: Negative for fracture Sinuses/Orbits: No evidence of injury IMPRESSION: No evidence of intracranial injury. Electronically Signed   By: Monte Fantasia M.D.   On: 07/06/2019 04:51   CT Lumbar Spine Wo Contrast  Result Date: 07/06/2019 CLINICAL DATA:  Fall with left hip and lower back pain EXAM: CT LUMBAR SPINE WITHOUT CONTRAST TECHNIQUE: Multidetector CT imaging of the lumbar spine was performed  without intravenous contrast administration. Multiplanar CT image reconstructions were also generated. COMPARISON:  None. FINDINGS: Segmentation: 5 lumbar type vertebrae based on the lowest ribs. Alignment: Slight anterolisthesis at L5-S1 Vertebrae: Small cortex fracture involving the lateral left sacral ala. No displacement or sacral foramina involvement. No vertebral fracture noted. Osteopenia. Paraspinal and other soft tissues: Diffuse atherosclerotic plaque. No acute soft tissue finding. Disc levels: Aside from L5-S1 there is relatively preserved disc height. Generalized degenerative facet spurring. There is up to moderate spinal stenosis at L3-4. IMPRESSION: Acute, nondisplaced left sacral ala fracture. Electronically Signed   By: Monte Fantasia M.D.   On: 07/06/2019 06:12   CT Hip Left Wo Contrast  Result Date: 07/06/2019 CLINICAL DATA:  85 year old female with history of trauma from a fall complaining of left hip and low back pain. EXAM: CT OF THE LEFT HIP WITHOUT CONTRAST TECHNIQUE: Multidetector CT imaging of the left hip was performed according to the standard protocol. Multiplanar CT image reconstructions were also generated. COMPARISON:  No priors. FINDINGS: Bones/Joint/Cartilage No acute displaced fracture noted. Left femoral head is located. There is joint space narrowing, subchondral sclerosis, subchondral cyst formation and osteophyte formation in the left hip joint, compatible with moderate osteoarthritis. Ligaments Suboptimally assessed by CT. Muscles and Tendons No acute abnormality noted. Soft tissues No unexpected fluid collection or inflammatory changes. Atherosclerotic calcification in the visualized vasculature. IMPRESSION: 1. No acute radiographic abnormality of the left hip. 2. Moderate left hip joint osteoarthritis. 3. Atherosclerosis. Electronically Signed   By: Vinnie Langton M.D.   On: 07/06/2019 05:56   DG Hip Unilat With Pelvis 2-3 Views Left  Result Date:  07/06/2019 CLINICAL DATA:  Fall with left hip pain EXAM: DG HIP (WITH OR WITHOUT PELVIS) 2-3V LEFT COMPARISON:  None. FINDINGS: No evidence of hip or pelvic ring fracture. Both hips are located. Generalized osteopenia. Atherosclerotic calcification IMPRESSION: No acute finding. Electronically Signed   By: Monte Fantasia M.D.   On: 07/06/2019 04:40    EKG: Independently reviewed.  Sinus rhythm  Assessment/Plan Principal Problem:   Intractable pain Active Problems:   Left wrist fracture   Accidental fall   Fracture of sacrum, closed (HCC)   Anxiety    Intractable pain Patient is status post fall with a left wrist fracture as well as a closed fracture of the sacrum. She is unable to bear weight on her left lower extremity due to the severity of the pain Pain control with IV morphine Orthopedic consult PT/OT evaluation   Status post fall Place patient on fall precautions   Anxiety  Continue Citalopram  DVT prophylaxis: Lovenox  Code Status: Full Family Communication: Plan of care was discussed with patient in detail.  They verbalize understanding and agree with the plan Disposition Plan: Back to previous home environment Consults called: Orthopedics    Kedrick Mcnamee MD [facility]     07/06/2019, 8:55 AM

## 2019-07-06 NOTE — ED Notes (Signed)
Message sent to pharmacy regarding verification of medications.

## 2019-07-06 NOTE — ED Notes (Signed)
This RN to bedside, pt visualized in NAD at this time, explained to patient awaiting pharmacy to send up her daily medications. Pt states understanding. Pain medication offered at this time. Pt states she is okay as long as she is not moving. Pt denies further needs. VSS at this time. Lights remain dimmed for patient comfort at this time.

## 2019-07-06 NOTE — ED Triage Notes (Signed)
Pt arrives from home with complaint of deformity to left wrist, skin tear to right elbow, and pain to left hip following a fall. States that left hip pain radiates across lower abdomen  Pt reports she does not know what caused her to fall. Pt is a&o x4 NAD on arrival.

## 2019-07-07 LAB — COMPREHENSIVE METABOLIC PANEL
ALT: 17 U/L (ref 0–44)
AST: 20 U/L (ref 15–41)
Albumin: 3.7 g/dL (ref 3.5–5.0)
Alkaline Phosphatase: 82 U/L (ref 38–126)
Anion gap: 8 (ref 5–15)
BUN: 13 mg/dL (ref 8–23)
CO2: 25 mmol/L (ref 22–32)
Calcium: 9.6 mg/dL (ref 8.9–10.3)
Chloride: 104 mmol/L (ref 98–111)
Creatinine, Ser: 0.55 mg/dL (ref 0.44–1.00)
GFR calc Af Amer: >60 mL/min (ref >60)
GFR calc non Af Amer: >60 mL/min (ref >60)
Glucose, Bld: 103 mg/dL — ABNORMAL HIGH (ref 70–99)
Potassium: 4 mmol/L (ref 3.5–5.1)
Sodium: 137 mmol/L (ref 135–145)
Total Bilirubin: 1.4 mg/dL — ABNORMAL HIGH (ref 0.3–1.2)
Total Protein: 6.2 g/dL — ABNORMAL LOW (ref 6.5–8.1)

## 2019-07-07 MED ORDER — MORPHINE SULFATE (PF) 2 MG/ML IV SOLN: 2.0000 mg | Freq: Two times a day (BID) | INTRAVENOUS | Status: DC | PRN

## 2019-07-07 MED ORDER — HYDROCODONE-ACETAMINOPHEN 5-325 MG PO TABS
1.0000 | ORAL_TABLET | Freq: Four times a day (QID) | ORAL | Status: DC | PRN
  Administered 2019-07-07 – 2019-07-09 (×6): 1 via ORAL
  Filled 2019-07-07 (×7): qty 1

## 2019-07-07 NOTE — Progress Notes (Signed)
  Subjective:   Procedure(s) (LRB): SACROPLASTY (N/A) OPEN REDUCTION INTERNAL FIXATION (ORIF) DISTAL RADIAL FRACTURE (Left)  Patient seated comfortably in bed as I enter the room.  Patient states she continues to have discomfort in a seated position with extreme pain with any motion felt in the low back/buttocks area as well as the anterior aspect of the pelvis and down her legs.  Patient denies any previous history of radicular pain prior to her fall..  Patient is tolerating the splint without difficulty.  Objective: Vital signs in last 24 hours: Temp:  [98 F (36.7 C)-99.3 F (37.4 C)] 98 F (36.7 C) (04/17 0737) Pulse Rate:  [72-93] 72 (04/17 0737) Resp:  [16-18] 16 (04/17 0737) BP: (115-157)/(60-93) 157/83 (04/17 0737) SpO2:  [93 %-98 %] 95 % (04/17 0737)  Intake/Output from previous day:  Intake/Output Summary (Last 24 hours) at 07/07/2019 1225 Last data filed at 07/07/2019 0452 Gross per 24 hour  Intake --  Output 450 ml  Net -450 ml    Intake/Output this shift: No intake/output data recorded.  Labs: Recent Labs    07/06/19 0351  HGB 14.0   Recent Labs    07/06/19 0351  WBC 14.7*  RBC 4.63  HCT 42.4  PLT 234   Recent Labs    07/06/19 0351 07/07/19 0446  NA 137 137  K 3.9 4.0  CL 104 104  CO2 23 25  BUN 18 13  CREATININE 1.00 0.55  GLUCOSE 130* 103*  CALCIUM 9.6 9.6   No results for input(s): LABPT, INR in the last 72 hours.   EXAM General - Patient is Alert, Appropriate and Oriented Low back-patient nontender to palpation over the right aspect of the sacrum, but very tender to palpation over the left.  Dorsiflexion/plantarflexion intact bilaterally.  Able to flex and extend toes of bilateral feet.  Sensation intact over bilateral saphenous, lateral sural cutaneous, superficial fibular, and deep fibular nerve distributions. Left wrist-Short splint in place without significant wear.  Patient able to flex and extend fingers.  Sensation intact over the  median, radial, and ulnar nerve distributions.    Assessment/Plan:   Procedure(s) (LRB): SACROPLASTY (N/A) OPEN REDUCTION INTERNAL FIXATION (ORIF) DISTAL RADIAL FRACTURE (Left) Principal Problem:   Intractable pain Active Problems:   Left wrist fracture   Accidental fall   Fracture of sacrum, closed (HCC)   Anxiety  Estimated body mass index is 24.45 kg/m as calculated from the following:   Height as of this encounter: 5\' 3"  (1.6 m).   Weight as of this encounter: 62.6 kg. Advance diet Up with therapy  Per discussion with Dr. Rudene Christians, if patient continues to have significant difficulty and pain with ambulation she will likely undergo a sacroplasty on Monday afternoon.  Patient would undergo ORIF of left distal radius fracture at that time as well.  Patient understands and agrees.  DVT Prophylaxis - Lovenox and Ted hose WBAT.   Cassell Smiles, PA-C [facility] 07/07/2019, 12:25 PM

## 2019-07-07 NOTE — Progress Notes (Signed)
PROGRESS NOTE    Laura Morrow  [id number] DOB: [birth year] DOA: 07/06/2019 PCP: [place]    Assessment & Plan:   Principal Problem:   Intractable pain Active Problems:   Left wrist fracture   Accidental fall   Fracture of sacrum, closed ([place])   Anxiety    Laura Morrow is a 85 y.o. female with medical history significant for anxiety who presented to the emergency department for evaluation of that she had a fall.   # Mechanical fall resulting in # Sacral fracture with severe pain # Left distal radius fracture displaced --will likely undergo a sacroplasty and ORIF of left distal radius fracture on Monday afternoon --Norco PRN with IV morphine for breakthrough --PT/OT  Anxiety  Continue Citalopram   DVT prophylaxis: Lovenox SQ Code Status: Full code  Family Communication:  Disposition Plan: After ortho surgeries on Monday.   Subjective and Interval History:  Pt reported pain only when she moves.  Good urine output but no BM yet.  No appetite.  No fever, dyspnea, chest pain, abdominal pain, N/V/D, dysuria.   Objective: Vitals:   07/06/19 1646 07/06/19 1742 07/06/19 2335 07/07/19 0737  BP: (!) 149/93 140/80 115/60 (!) 157/83  Pulse: 92 93 85 72  Resp: 18 18 18 16   Temp: 99.3 F (37.4 C) 99.3 F (37.4 C) 98.3 F (36.8 C) 98 F (36.7 C)  TempSrc:  Oral Oral   SpO2: 95% 98% 93% 95%  Weight:      Height:        Intake/Output Summary (Last 24 hours) at 07/07/2019 1637 Last data filed at 07/07/2019 0452 Gross per 24 hour  Intake --  Output 450 ml  Net -450 ml   Filed Weights   07/06/19 0316  Weight: 62.6 kg    Examination:   Constitutional: NAD, AAOx3 HEENT: conjunctivae and lids normal, EOMI CV: RRR no M,R,G. Distal pulses +2.  No cyanosis.   RESP: CTA B/L, normal respiratory effort  GI: +BS, NTND Extremities: No effusions, edema, or tenderness in BLE MSK: Left arm in splint SKIN: warm, dry and intact Neuro: II - XII grossly  intact.  Sensation intact Psych: Normal mood and affect.  Appropriate judgement and reason   Data Reviewed: I have personally reviewed following labs and imaging studies  CBC: Recent Labs  Lab 07/06/19 0351  WBC 14.7*  NEUTROABS 12.9*  HGB 14.0  HCT 42.4  MCV 91.6  PLT [id number]   Basic Metabolic Panel: Recent Labs  Lab 07/06/19 0351 07/07/19 0446  NA 137 137  K 3.9 4.0  CL 104 104  CO2 23 25  GLUCOSE 130* 103*  BUN 18 13  CREATININE 1.00 0.55  CALCIUM 9.6 9.6   GFR: Estimated Creatinine Clearance: 44.8 mL/min (by C-G formula based on SCr of 0.55 mg/dL). Liver Function Tests: Recent Labs  Lab 07/07/19 0446  AST 20  ALT 17  ALKPHOS 82  BILITOT 1.4*  PROT 6.2*  ALBUMIN 3.7   No results for input(s): LIPASE, AMYLASE in the last 168 hours. No results for input(s): AMMONIA in the last 168 hours. Coagulation Profile: No results for input(s): INR, PROTIME in the last 168 hours. Cardiac Enzymes: No results for input(s): CKTOTAL, CKMB, CKMBINDEX, TROPONINI in the last 168 hours. BNP (last 3 results) No results for input(s): PROBNP in the last 8760 hours. HbA1C: No results for input(s): HGBA1C in the last 72 hours. CBG: No results for input(s): GLUCAP in the last 168 hours. Lipid Profile: No results for  input(s): CHOL, HDL, LDLCALC, TRIG, CHOLHDL, LDLDIRECT in the last 72 hours. Thyroid Function Tests: No results for input(s): TSH, T4TOTAL, FREET4, T3FREE, THYROIDAB in the last 72 hours. Anemia Panel: No results for input(s): VITAMINB12, FOLATE, FERRITIN, TIBC, IRON, RETICCTPCT in the last 72 hours. Sepsis Labs: No results for input(s): PROCALCITON, LATICACIDVEN in the last 168 hours.  Recent Results (from the past 240 hour(s))  SARS CORONAVIRUS 2 (TAT 6-24 HRS) Nasopharyngeal Nasopharyngeal Swab     Status: None   Collection Time: 07/06/19  6:41 AM   Specimen: Nasopharyngeal Swab  Result Value Ref Range Status   SARS Coronavirus 2 NEGATIVE NEGATIVE Final     Comment: (NOTE) SARS-CoV-2 target nucleic acids are NOT DETECTED. The SARS-CoV-2 RNA is generally detectable in upper and lower respiratory specimens during the acute phase of infection. Negative results do not preclude SARS-CoV-2 infection, do not rule out co-infections with other pathogens, and should not be used as the sole basis for treatment or other patient management decisions. Negative results must be combined with clinical observations, patient history, and epidemiological information. The expected result is Negative. Fact Sheet for Patients: SugarRoll.be Fact Sheet for Healthcare Providers: https://www.woods-mathews.com/ This test is not yet approved or cleared by the [place] FDA and  has been authorized for detection and/or diagnosis of SARS-CoV-2 by FDA under an Emergency Use Authorization (EUA). This EUA will remain  in effect (meaning this test can be used) for the duration of the COVID-19 declaration under Section 56 4(b)(1) of the Act, 21 U.S.C. section 360bbb-3(b)(1), unless the authorization is terminated or revoked sooner. Performed at [facility], [place] [address], [place], [place] [address]       Radiology Studies: DG Elbow Complete Right  Result Date: 07/06/2019 CLINICAL DATA:  Left wrist deformity with skin tear. EXAM: RIGHT ELBOW - COMPLETE 3+ VIEW COMPARISON:  None. FINDINGS: There is no evidence of fracture, dislocation, or joint effusion. No opaque foreign body. Generalized osteopenic appearance IMPRESSION: No acute finding. Electronically Signed   By: Monte Fantasia M.D.   On: 07/06/2019 04:41   DG Wrist Complete Left  Result Date: 07/06/2019 CLINICAL DATA:  Fall with wrist deformity EXAM: LEFT WRIST - COMPLETE 3+ VIEW COMPARISON:  None. FINDINGS: Acute primarily transverse fracture through the distal radial metaphysis with mainly medial sided impaction. There is a branching fracture line extending  longitudinally into the proximal diaphysis. Acute fracture at the metaphysis and styloid process of the ulna, nondisplaced. Regional soft tissue swelling. A true lateral view could not be obtained, there is gross alignment at the wrist joint. Advanced first [place] and STT osteoarthritis. IMPRESSION: Distal radius and ulnar fractures with radial impaction. Electronically Signed   By: Monte Fantasia M.D.   On: 07/06/2019 04:42   CT Head Wo Contrast  Result Date: 07/06/2019 CLINICAL DATA:  Fall with wrist fracture. EXAM: CT HEAD WITHOUT CONTRAST TECHNIQUE: Contiguous axial images were obtained from the base of the skull through the vertex without intravenous contrast. COMPARISON:  None. FINDINGS: Brain: No evidence of acute infarction, hemorrhage, hydrocephalus, extra-axial collection or mass lesion/mass effect. Chronic small vessel ischemia in the deep cerebral white matter with lacunar infarct in the right thalamus that is chronic based on history. Vascular: Atherosclerotic calcification. Skull: Negative for fracture Sinuses/Orbits: No evidence of injury IMPRESSION: No evidence of intracranial injury. Electronically Signed   By: Monte Fantasia M.D.   On: 07/06/2019 04:51   CT Lumbar Spine Wo Contrast  Result Date: 07/06/2019 CLINICAL DATA:  Fall  with left hip and lower back pain EXAM: CT LUMBAR SPINE WITHOUT CONTRAST TECHNIQUE: Multidetector CT imaging of the lumbar spine was performed without intravenous contrast administration. Multiplanar CT image reconstructions were also generated. COMPARISON:  None. FINDINGS: Segmentation: 5 lumbar type vertebrae based on the lowest ribs. Alignment: Slight anterolisthesis at L5-S1 Vertebrae: Small cortex fracture involving the lateral left sacral ala. No displacement or sacral foramina involvement. No vertebral fracture noted. Osteopenia. Paraspinal and other soft tissues: Diffuse atherosclerotic plaque. No acute soft tissue finding. Disc levels: Aside from L5-S1 there  is relatively preserved disc height. Generalized degenerative facet spurring. There is up to moderate spinal stenosis at L3-4. IMPRESSION: Acute, nondisplaced left sacral ala fracture. Electronically Signed   By: Monte Fantasia M.D.   On: 07/06/2019 06:12   CT Hip Left Wo Contrast  Result Date: 07/06/2019 CLINICAL DATA:  85 year old female with history of trauma from a fall complaining of left hip and low back pain. EXAM: CT OF THE LEFT HIP WITHOUT CONTRAST TECHNIQUE: Multidetector CT imaging of the left hip was performed according to the standard protocol. Multiplanar CT image reconstructions were also generated. COMPARISON:  No priors. FINDINGS: Bones/Joint/Cartilage No acute displaced fracture noted. Left femoral head is located. There is joint space narrowing, subchondral sclerosis, subchondral cyst formation and osteophyte formation in the left hip joint, compatible with moderate osteoarthritis. Ligaments Suboptimally assessed by CT. Muscles and Tendons No acute abnormality noted. Soft tissues No unexpected fluid collection or inflammatory changes. Atherosclerotic calcification in the visualized vasculature. IMPRESSION: 1. No acute radiographic abnormality of the left hip. 2. Moderate left hip joint osteoarthritis. 3. Atherosclerosis. Electronically Signed   By: Vinnie Langton M.D.   On: 07/06/2019 05:56   DG Hip Unilat With Pelvis 2-3 Views Left  Result Date: 07/06/2019 CLINICAL DATA:  Fall with left hip pain EXAM: DG HIP (WITH OR WITHOUT PELVIS) 2-3V LEFT COMPARISON:  None. FINDINGS: No evidence of hip or pelvic ring fracture. Both hips are located. Generalized osteopenia. Atherosclerotic calcification IMPRESSION: No acute finding. Electronically Signed   By: Monte Fantasia M.D.   On: 07/06/2019 04:40     Scheduled Meds: . cholecalciferol  5,000 Units Oral Daily  . citalopram  40 mg Oral Daily  . enoxaparin (LOVENOX) injection  40 mg Subcutaneous Q24H  . mesalamine  1.2 g Oral Daily  .  methocarbamol  500 mg Oral TID  . oxybutynin  5 mg Oral BID  . valACYclovir  500 mg Oral Daily   Continuous Infusions:   LOS: 1 day     Enzo Bi, MD [facility] If 7PM-7AM, please contact night-coverage 07/07/2019, 4:37 PM

## 2019-07-07 NOTE — Evaluation (Signed)
Occupational Therapy Evaluation Patient Details Name: Pennie Shaw MRN: [id number] DOB: [birth year] Today's Date: 07/07/2019    History of Present Illness Kadasia Massar is an 50yoF who sustained a fall at home, not s/p Left Colles fracture (preoperative) and Left sacral ala fracture (preoperative). PTA pt lived alone, AMB limited community distances without assist, some recent bila tknee pain with longer distance AMB.   Clinical Impression   Ms Fruehauf was seen for OT evaluation this date. Prior to hospital admission, pt was Independent c I/ADLs including driving. Pt lives alone in [place] living facility c family available nearby PRN. Pt presents to acute OT demonstrating impaired ADL performance and functional mobility 2/2 decreased LB access, functional ROM/balance deficits, and decreased awareness of functional application of WBing precautions. Upon entry, pt pleasant and agreeable to tx, but hesitant to attempt mobility 2/2 pain c attempted mobilization yesterday (unable to mobilize yesterday c TOTAL A x2 per PT eval). Pt agreeable to attempt c assist of PT (overlapping session). MIN A x2 sup>sit, assist for trunk support and LLE advancement. MIN A sit<>stand at EOB c L platform walker and VCs to maintain WBing precautions. Pt currently requires SBA toileting at [facility] over standard commode, MIN A for perihygiene in standing. SETUP hand washing standing sink side. MOD A bathing in standing at sink - assist for BLE, RUE and back. SETUP and increased time self-feeding/drinking. Pt would benefit from skilled OT to address noted impairments and functional limitations (see below for any additional details) in order to maximize safety and independence while minimizing falls risk and caregiver burden. Upon hospital discharge, recommend STR to maximize pt safety and return to PLOF.     Follow Up Recommendations  SNF;Supervision - Intermittent(May progress following potential ORIF and sacroplasty  Monday)    Equipment Recommendations  3 in 1 bedside commode    Recommendations for Other Services       Precautions / Restrictions Precautions Precautions: Fall Restrictions Weight Bearing Restrictions: Yes LUE Weight Bearing: Weight bear through elbow only LLE Weight Bearing: Weight bearing as tolerated      Mobility Bed Mobility Overal bed mobility: Needs Assistance Bed Mobility: Supine to Sit     Supine to sit: Min assist;HOB elevated     General bed mobility comments: MIN A for trunk support and to advance LLE  Transfers Overall transfer level: Needs assistance Equipment used: Left platform walker Transfers: Sit to/from Stand Sit to Stand: Min assist         General transfer comment: MIN A + L platform walker sit>stand at bed improving to CGA from elevated BSC height. CGA stand>sit at chair and toilet     Balance Overall balance assessment: Needs assistance Sitting-balance support: Single extremity supported;Feet supported Sitting balance-Leahy Scale: Good Sitting balance - Comments: VCs to maintain LUE WBing precaution (WB through elbow only)   Standing balance support: Single extremity supported;During functional activity Standing balance-Leahy Scale: Good Standing balance comment: VCs to maintain LUE WBing pcn during functional activity                            ADL either performed or assessed with clinical judgement   ADL Overall ADL's : Needs assistance/impaired                                       General ADL Comments: SBA toileting at [facility] over  standard commode, MIN A perihygiene in standing. SETUP hand washing standing sink side. MOD A bathing in standing at sink - assist for BLE, RUE and back. MOD I self-drinking. SETUP and increased time self-feeding.      Vision         Perception     Praxis      Pertinent Vitals/Pain Pain Assessment: 0-10 Pain Score: 5  Pain Location: Left wrist; Left  pelvis/belly; Pain Descriptors / Indicators: Aching Pain Intervention(s): Repositioned     Hand Dominance Right   Extremity/Trunk Assessment Upper Extremity Assessment Upper Extremity Assessment: LUE deficits/detail LUE Deficits / Details: LUE splint donned and wrapped from MCP to elbow limiting elbow flexion LUE: Unable to fully assess due to immobilization   Lower Extremity Assessment Lower Extremity Assessment: Overall WFL for tasks assessed   Cervical / Trunk Assessment Cervical / Trunk Assessment: Kyphotic   Communication Communication Communication: No difficulties   Cognition Arousal/Alertness: Awake/alert Behavior During Therapy: WFL for tasks assessed/performed Overall Cognitive Status: Within Functional Limits for tasks assessed                                     General Comments       Exercises Exercises: Other exercises Other Exercises Other Exercises: PT and caregiver educated re: OT role, d/c recommendations, DME recommendations, hemidressing techniques, energy conservation strategies, positioning for LB pain, RW management Other Exercises: Toileting, bathing, hand washing, sup>sit, sit<>stand x2, postiioning for pain management, functional application of WBing pcn, hair brushing,    Shoulder Instructions      Home Living Family/patient expects to be discharged to:: Private residence Living Arrangements: Alone(Independent Living Facility - [facility]) Available Help at Discharge: Family(DTR and SIL live 1 miles away) Type of Home: Independent living facility Home Access: Level entry     Home Layout: One level     Bathroom Shower/Tub: Occupational hygienist: Yes How Accessible: Accessible via walker Home Equipment: Wheelchair - manual;Walker - 4 wheels;Grab bars - tub/shower   Additional Comments: w/c and 4WW available from DTR if needed      Prior Functioning/Environment Level of Independence: Independent(broke  other wrist 5ya during a fall.)        Comments: still drives, makes groceries; has limiting bilat knee pain.        OT Problem List: Decreased strength;Decreased range of motion;Decreased knowledge of use of DME or AE;Decreased knowledge of precautions;Impaired UE functional use      OT Treatment/Interventions: Self-care/ADL training;Therapeutic exercise;Energy conservation;DME and/or AE instruction;Therapeutic activities;Patient/family education;Balance training    OT Goals(Current goals can be found in the care plan section) Acute Rehab OT Goals Patient Stated Goal: resolve pain, return to home AMB OT Goal Formulation: With patient/family Time For Goal Achievement: 07/21/19 Potential to Achieve Goals: Good ADL Goals Pt Will Perform Lower Body Dressing: sit to/from stand;with supervision;with set-up(c LRAD PRN) Pt Will Transfer to Toilet: with supervision;ambulating;regular height toilet Pt Will Perform Toileting - Clothing Manipulation and hygiene: with supervision;sit to/from stand  OT Frequency: Min 2X/week   Barriers to D/C: Decreased caregiver support          Co-evaluation              AM-PAC OT "6 Clicks" Daily Activity     Outcome Measure Help from another person eating meals?: A Little Help from another person taking care of personal grooming?: A Little Help from  another person toileting, which includes using toliet, bedpan, or urinal?: A Little Help from another person bathing (including washing, rinsing, drying)?: A Lot Help from another person to put on and taking off regular upper body clothing?: A Little Help from another person to put on and taking off regular lower body clothing?: A Lot 6 Click Score: 16   End of Session Equipment Utilized During Treatment: Gait belt;Other (comment)(L platform walker) Nurse Communication: Mobility status  Activity Tolerance: Patient tolerated treatment well Patient left: in chair;with call bell/phone within  reach;with chair alarm set;with family/visitor present  OT Visit Diagnosis: Unsteadiness on feet (R26.81);Other abnormalities of gait and mobility (R26.89)                Time: [id number] OT Time Calculation (min): 62 min Charges:  OT General Charges $OT Visit: 1 Visit OT Evaluation $OT Eval Moderate Complexity: 1 Mod OT Treatments $Self Care/Home Management : 23-37 mins  Dessie Coma, M.S. OTR/L  07/07/19, 3:21 PM

## 2019-07-07 NOTE — Progress Notes (Signed)
Physical Therapy Treatment Patient Details Name: Laura Morrow MRN: [id number] DOB: 1935-01-30 Today's Date: 07/07/2019    History of Present Illness Laura Morrow is an 84yoF who sustained a fall at home, not s/p Left Colles fracture (preoperative) and Left sacral ala fracture (preoperative). PTA pt lived alone, AMB limited community distances without assist, some recent bila tknee pain with longer distance AMB.    PT Comments    Pt was supine in bed upon arriving. She agrees to PT session and si cooperative and pleasant throughout. She was able to exit bed with min assist and stand to platform walker with min assist. Vcs for hand placement and technique. Pt was able to ambulate to BR ~ 25 ft with CGA for safety. Overall demonstrates safe steady gait kinematics but needed cueing to avoid scissoring with turning. Pt tolerated session well and was with OT at conclusion of PT session. Pt currently will need CIR/SNF but if continues to progress with acute PT, may be able to transition to home with HHPT pending progress. Pt is scheduled for surgery for Monday (per pt). PT will continue to follow closely.    Follow Up Recommendations  Supervision for mobility/OOB;Supervision/Assistance - 24 hour;CIR     Equipment Recommendations  Other (comment)(defer to next level of care)    Recommendations for Other Services       Precautions / Restrictions Precautions Precautions: Fall Restrictions Weight Bearing Restrictions: Yes LUE Weight Bearing: Weight bear through elbow only LLE Weight Bearing: Weight bearing as tolerated    Mobility  Bed Mobility Overal bed mobility: Needs Assistance Bed Mobility: Supine to Sit     Supine to sit: Min assist;HOB elevated     General bed mobility comments: MIN A for trunk support and to advance LLE  Transfers Overall transfer level: Needs assistance Equipment used: Left platform walker Transfers: Sit to/from Stand Sit to Stand: Min assist          General transfer comment: MIN A + L platform walker sit>stand at bed improving to CGA from elevated BSC height. CGA stand>sit at chair and toilet   Ambulation/Gait Ambulation/Gait assistance: Min guard Gait Distance (Feet): 25 Feet Assistive device: Left platform walker Gait Pattern/deviations: WFL(Within Functional Limits) Gait velocity: decreased   General Gait Details: Pt demonstrated safe steady gait kinematics and was able to amblate to BR and return. NO LOB or unsteadiness noted. Vcs for maintaining wide BOS and technique with turning.   Stairs             Wheelchair Mobility    Modified Rankin (Stroke Patients Only)       Balance Overall balance assessment: Needs assistance Sitting-balance support: Single extremity supported;Feet supported Sitting balance-Leahy Scale: Good Sitting balance - Comments: VCs to maintain LUE WBing precaution (WB through elbow only)   Standing balance support: Single extremity supported;During functional activity Standing balance-Leahy Scale: Good Standing balance comment: VCs to maintain LUE WBing pcn during functional activity                             Cognition Arousal/Alertness: Awake/alert Behavior During Therapy: WFL for tasks assessed/performed Overall Cognitive Status: Within Functional Limits for tasks assessed                                 General Comments: Pt is A and O x 4 and very cooperative and motivated  Exercises Other Exercises Other Exercises: PT and caregiver educated re: OT role, d/c recommendations, DME recommendations, hemidressing techniques, energy conservation strategies, positioning for LB pain, RW management Other Exercises: Toileting, bathing, hand washing, sup>sit, sit<>stand x2, postiioning for pain management, functional application of WBing pcn, hair brushing,     General Comments        Pertinent Vitals/Pain Pain Assessment: 0-10 Pain Score: 5  Pain  Location: Left wrist; Left pelvis/belly; Pain Descriptors / Indicators: Aching Pain Intervention(s): Repositioned    Home Living Family/patient expects to be discharged to:: Private residence Living Arrangements: Alone([facility]dependent Living Facility - [facility]) Available Help at Discharge: Family(DTR and SIL live 1 miles away) Type of Home: Independent living facility Home Access: Level entry   Home Layout: One level Home Equipment: Wheelchair - [employer] - 4 wheels;Grab bars - tub/shower Additional Comments: w/c and 4WW available from DTR if needed    Prior Function Level of Independence: Independent(broke other wrist 5ya during a fall.)      Comments: still drives, makes groceries; has limiting bilat knee pain.   PT Goals (current goals can now be found in the care plan section) Acute Rehab PT Goals Patient Stated Goal: resolve pain, return to home AMB Progress towards PT goals: Progressing toward goals    Frequency    7X/week      PT Plan Current plan remains appropriate    Co-evaluation              AM-PAC PT "6 Clicks" Mobility   Outcome Measure  Help needed turning from your back to your side while in a flat bed without using bedrails?: A Little Help needed moving from lying on your back to sitting on the side of a flat bed without using bedrails?: A Little Help needed moving to and from a bed to a chair (including a wheelchair)?: A Little Help needed standing up from a chair using your arms (e.g., wheelchair or bedside chair)?: A Little Help needed to walk in hospital room?: A Little Help needed climbing 3-5 steps with a railing? : A Lot 6 Click Score: 17    End of Session Equipment Utilized During Treatment: Gait belt Activity Tolerance: Patient tolerated treatment well Patient left: Other (comment)(with OT at conclusion of session) Nurse Communication: Mobility status PT Visit Diagnosis: Unsteadiness on feet (R26.81);Other abnormalities of gait and  mobility (R26.89);Difficulty in walking, not elsewhere classified (R26.2);History of falling (Z91.81);Muscle weakness (generalized) (M62.81)     Time: 1340-1410 PT Time Calculation (min) (ACUTE ONLY): 30 min  Charges:  $Gait Training: 8-22 mins $Therapeutic Activity: 8-22 mins                    Julaine Fusi PTA 07/07/19, 4:07 PM

## 2019-07-08 LAB — MAGNESIUM: Magnesium: 2.2 mg/dL (ref 1.7–2.4)

## 2019-07-08 LAB — CBC
HCT: 42.1 % (ref 36.0–46.0)
Hemoglobin: 13.9 g/dL (ref 12.0–15.0)
MCH: 30.3 pg (ref 26.0–34.0)
MCHC: 33 g/dL (ref 30.0–36.0)
MCV: 91.7 fL (ref 80.0–100.0)
Platelets: 216 10*3/uL (ref 150–400)
RBC: 4.59 MIL/uL (ref 3.87–5.11)
RDW: 13.5 % (ref 11.5–15.5)
WBC: 8.6 10*3/uL (ref 4.0–10.5)
nRBC: 0 % (ref 0.0–0.2)

## 2019-07-08 LAB — BASIC METABOLIC PANEL
Anion gap: 9 (ref 5–15)
BUN: 13 mg/dL (ref 8–23)
CO2: 25 mmol/L (ref 22–32)
Calcium: 9.8 mg/dL (ref 8.9–10.3)
Chloride: 104 mmol/L (ref 98–111)
Creatinine, Ser: 0.57 mg/dL (ref 0.44–1.00)
GFR calc Af Amer: >60 mL/min (ref >60)
GFR calc non Af Amer: >60 mL/min (ref >60)
Glucose, Bld: 97 mg/dL (ref 70–99)
Potassium: 3.8 mmol/L (ref 3.5–5.1)
Sodium: 138 mmol/L (ref 135–145)

## 2019-07-08 NOTE — Progress Notes (Signed)
Physical Therapy Treatment Patient Details Name: Laura Morrow MRN: [id number] DOB: 1934/10/23 Today's Date: 07/08/2019    History of Present Illness Laura Morrow is an 66yoF who sustained a fall at home, not s/p Left Colles fracture (preoperative) and Left sacral ala fracture (preoperative). PTA pt lived alone, AMB limited community distances without assist, some recent bila tknee pain with longer distance AMB.    PT Comments    Pt ready for session.  Pain better "annoying".  Participated in exercises as described below. To EOB with min a x 1.  She is able to stand and walk to bathroom with Platform walker and min a x 1.  Generally steady gait but requires +1 assist at all times for safety.  She does not think she can progress walking further at this time.   Follow Up Recommendations  Supervision for mobility/OOB;Supervision/Assistance - 24 hour;CIR     Equipment Recommendations       Recommendations for Other Services       Precautions / Restrictions Precautions Precautions: Fall Restrictions Weight Bearing Restrictions: Yes LUE Weight Bearing: Weight bear through elbow only LLE Weight Bearing: Weight bearing as tolerated    Mobility  Bed Mobility Overal bed mobility: Needs Assistance Bed Mobility: Supine to Sit     Supine to sit: Min assist     General bed mobility comments: pt opted to get out of R side of bed requiring increased assist due to L UE  Transfers Overall transfer level: Needs assistance Equipment used: Left platform walker Transfers: Sit to/from Stand Sit to Stand: Min assist            Ambulation/Gait Ambulation/Gait assistance: Min guard Gait Distance (Feet): 25 Feet Assistive device: Left platform walker Gait Pattern/deviations: Step-to pattern Gait velocity: decreased       Stairs             Wheelchair Mobility    Modified Rankin (Stroke Patients Only)       Balance Overall balance assessment: Needs  assistance Sitting-balance support: Single extremity supported;Feet supported Sitting balance-Leahy Scale: Good     Standing balance support: Single extremity supported;During functional activity Standing balance-Leahy Scale: Good Standing balance comment: VCs to maintain LUE WBing pcn during functional activity                             Cognition Arousal/Alertness: Awake/alert Behavior During Therapy: WFL for tasks assessed/performed Overall Cognitive Status: Within Functional Limits for tasks assessed                                 General Comments: Pt is A and O x 4 and very cooperative and motivated      Exercises Other Exercises Other Exercises: BLE A./AAROM ankle pumps, quad sets, ab/add x 10 prior to OOB    General Comments        Pertinent Vitals/Pain Pain Assessment: Faces Faces Pain Scale: Hurts a little bit Pain Location: Left wrist; Left pelvis - annoying Pain Descriptors / Indicators: Aching Pain Intervention(s): Limited activity within patient's tolerance;Monitored during session;Repositioned    Home Living                      Prior Function            PT Goals (current goals can now be found in the care plan section) Progress towards PT goals:  Progressing toward goals    Frequency    7X/week      PT Plan Current plan remains appropriate    Co-evaluation              AM-PAC PT "6 Clicks" Mobility   Outcome Measure  Help needed turning from your back to your side while in a flat bed without using bedrails?: A Little Help needed moving from lying on your back to sitting on the side of a flat bed without using bedrails?: A Little Help needed moving to and from a bed to a chair (including a wheelchair)?: A Little Help needed standing up from a chair using your arms (e.g., wheelchair or bedside chair)?: A Little Help needed to walk in hospital room?: A Little Help needed climbing 3-5 steps with a  railing? : A Lot 6 Click Score: 17    End of Session Equipment Utilized During Treatment: Gait belt Activity Tolerance: Patient tolerated treatment well Patient left: with call bell/phone within reach;in chair;with chair alarm set Nurse Communication: Mobility status       Time: [id number] PT Time Calculation (min) (ACUTE ONLY): 25 min  Charges:  $Gait Training: 8-22 mins $Therapeutic Exercise: 8-22 mins                    Chesley Noon, PTA 07/08/19, 9:24 AM

## 2019-07-08 NOTE — Anesthesia Preprocedure Evaluation (Addendum)
Anesthesia Evaluation  Patient identified by MRN, date of birth, ID band Patient awake    Reviewed: Allergy & Precautions, H&P , NPO status , Patient's Chart, lab work & pertinent test results, reviewed documented beta blocker date and time   Airway Mallampati: II  TM Distance: >3 FB Neck ROM: full    Dental  (+) Teeth Intact   Pulmonary neg pulmonary ROS,    Pulmonary exam normal        Cardiovascular Exercise Tolerance: Good negative cardio ROS Normal cardiovascular exam Rhythm:regular Rate:Normal     Neuro/Psych Anxiety negative neurological ROS  negative psych ROS   GI/Hepatic negative GI ROS, Neg liver ROS,   Endo/Other  negative endocrine ROS  Renal/GU negative Renal ROS  negative genitourinary   Musculoskeletal   Abdominal   Peds  Hematology negative hematology ROS (+)   Anesthesia Other Findings Past Medical History: No date: Anxiety   Reproductive/Obstetrics negative OB ROS                            Anesthesia Physical Anesthesia Plan  ASA: II  Anesthesia Plan: General ETT   Post-op Pain Management:    Induction:   PONV Risk Score and Plan: 4 or greater  Airway Management Planned:   Additional Equipment:   Intra-op Plan:   Post-operative Plan:   Informed Consent: I have reviewed the patients History and Physical, chart, labs and discussed the procedure including the risks, benefits and alternatives for the proposed anesthesia with the patient or authorized representative who has indicated his/her understanding and acceptance.     Dental Advisory Given  Plan Discussed with: CRNA  Anesthesia Plan Comments:         Anesthesia Quick Evaluation

## 2019-07-08 NOTE — Progress Notes (Signed)
  Subjective:   Procedure(s) (LRB): SACROPLASTY (N/A) OPEN REDUCTION INTERNAL FIXATION (ORIF) DISTAL RADIAL FRACTURE (Left)  Patient states her pain level remains about the same as yesterday.  She complains of discomfort when seated and states her pain increases with walking.  She states her left wrist only bothers her when she moves it.  Denies chest pain, shortness of breath, nausea, vomiting, numbness or tingling.  Objective: Vital signs in last 24 hours: Temp:  [97.7 F (36.5 C)-98.2 F (36.8 C)] 98.2 F (36.8 C) (04/18 0737) Pulse Rate:  [71-81] 81 (04/18 0737) Resp:  [16-20] 20 (04/18 0737) BP: (125-158)/(74-92) 158/90 (04/18 0737) SpO2:  [94 %-95 %] 95 % (04/18 0737)  Intake/Output from previous day:  Intake/Output Summary (Last 24 hours) at 07/08/2019 1329 Last data filed at 07/08/2019 0533 Gross per 24 hour  Intake --  Output 1400 ml  Net -1400 ml    Intake/Output this shift: No intake/output data recorded.  Labs: Recent Labs    07/06/19 0351 07/08/19 0445  HGB 14.0 13.9   Recent Labs    07/06/19 0351 07/08/19 0445  WBC 14.7* 8.6  RBC 4.63 4.59  HCT 42.4 42.1  PLT 234 216   Recent Labs    07/07/19 0446 07/08/19 0445  NA 137 138  K 4.0 3.8  CL 104 104  CO2 25 25  BUN 13 13  CREATININE 0.55 0.57  GLUCOSE 103* 97  CALCIUM 9.6 9.8   No results for input(s): LABPT, INR in the last 72 hours.   EXAM General - Patient is Alert, Appropriate and Oriented Extremity -left wrist splint remains in place.  Sensation intact over the median, radial, and ulnar nerve distributions. Motor Function -able to flex and extend fingers of left hand.  Tender to palpation over the left aspect of the sacrum.   Assessment/Plan:   Procedure(s) (LRB): SACROPLASTY (N/A) OPEN REDUCTION INTERNAL FIXATION (ORIF) DISTAL RADIAL FRACTURE (Left) Principal Problem:   Intractable pain Active Problems:   Left wrist fracture   Accidental fall   Fracture of sacrum, closed  (HCC)   Anxiety  Estimated body mass index is 24.45 kg/m as calculated from the following:   Height as of this encounter: 5\' 3"  (1.6 m).   Weight as of this encounter: 62.6 kg. Advance diet Up with therapy  Patient will likely undergo ORIF of left wrist and sacroplasty with Dr Rudene Christians tomorrow  DVT Prophylaxis - Lovenox and Ted hose Weight-Bearing as tolerated.  Cassell Smiles, PA-C [facility] Orthopaedic Surgery 07/08/2019, 1:29 PM

## 2019-07-08 NOTE — Progress Notes (Signed)
PROGRESS NOTE    Laura Morrow  [id number] DOB: 09/28/1934 DOA: 07/06/2019 PCP: [place]    Assessment & Plan:   Principal Problem:   Intractable pain Active Problems:   Left wrist fracture   Accidental fall   Fracture of sacrum, closed ([place])   Anxiety    Laura Morrow is a 85 y.o. female with medical history significant for anxiety who presented to the emergency department for evaluation of that she had a fall.   # Mechanical fall resulting in # Sacral fracture with severe pain # Left distal radius fracture displaced --Patient will likely undergo ORIF of left wrist and sacroplasty with Dr Rudene Christians tomorrow --Norco PRN with IV morphine for breakthrough --PT/OT  Anxiety  Continue Citalopram   DVT prophylaxis: Lovenox SQ Code Status: Full code  Family Communication:  Disposition Plan: After ortho surgeries on Monday.   Subjective and Interval History:  Pt reported constant pain in her back, but tolerable.  No fever, dyspnea, chest pain, abdominal pain, N/V/D, dysuria.   Objective: Vitals:   07/07/19 0737 07/07/19 1640 07/08/19 0001 07/08/19 0737  BP: (!) 157/83 (!) 131/92 125/74 (!) 158/90  Pulse: 72 79 71 81  Resp: 16  16 20   Temp: 98 F (36.7 C) 97.9 F (36.6 C) 97.7 F (36.5 C) 98.2 F (36.8 C)  TempSrc:  Oral Oral Oral  SpO2: 95% 94% 94% 95%  Weight:      Height:        Intake/Output Summary (Last 24 hours) at 07/08/2019 1506 Last data filed at 07/08/2019 0533 Gross per 24 hour  Intake --  Output 1400 ml  Net -1400 ml   Filed Weights   07/06/19 0316  Weight: 62.6 kg    Examination:   Constitutional: NAD, AAOx3, up in chair HEENT: conjunctivae and lids normal, EOMI CV: RRR no M,R,G. Distal pulses +2.  No cyanosis.   RESP: CTA B/L, normal respiratory effort  GI: +BS, NTND Extremities: No effusions, edema, or tenderness in BLE MSK: Left arm in splint SKIN: warm, dry and intact Neuro: II - XII grossly intact.  Sensation  intact Psych: Normal mood and affect.  Appropriate judgement and reason   Data Reviewed: I have personally reviewed following labs and imaging studies  CBC: Recent Labs  Lab 07/06/19 0351 07/08/19 0445  WBC 14.7* 8.6  NEUTROABS 12.9*  --   HGB 14.0 13.9  HCT 42.4 42.1  MCV 91.6 91.7  PLT 234 [id number]   Basic Metabolic Panel: Recent Labs  Lab 07/06/19 0351 07/07/19 0446 07/08/19 0445  NA 137 137 138  K 3.9 4.0 3.8  CL 104 104 104  CO2 23 25 25   GLUCOSE 130* 103* 97  BUN 18 13 13   CREATININE 1.00 0.55 0.57  CALCIUM 9.6 9.6 9.8  MG  --   --  2.2   GFR: Estimated Creatinine Clearance: 44.8 mL/min (by C-G formula based on SCr of 0.57 mg/dL). Liver Function Tests: Recent Labs  Lab 07/07/19 0446  AST 20  ALT 17  ALKPHOS 82  BILITOT 1.4*  PROT 6.2*  ALBUMIN 3.7   No results for input(s): LIPASE, AMYLASE in the last 168 hours. No results for input(s): AMMONIA in the last 168 hours. Coagulation Profile: No results for input(s): INR, PROTIME in the last 168 hours. Cardiac Enzymes: No results for input(s): CKTOTAL, CKMB, CKMBINDEX, TROPONINI in the last 168 hours. BNP (last 3 results) No results for input(s): PROBNP in the last 8760 hours. HbA1C: No results for  input(s): HGBA1C in the last 72 hours. CBG: No results for input(s): GLUCAP in the last 168 hours. Lipid Profile: No results for input(s): CHOL, HDL, LDLCALC, TRIG, CHOLHDL, LDLDIRECT in the last 72 hours. Thyroid Function Tests: No results for input(s): TSH, T4TOTAL, FREET4, T3FREE, THYROIDAB in the last 72 hours. Anemia Panel: No results for input(s): VITAMINB12, FOLATE, FERRITIN, TIBC, IRON, RETICCTPCT in the last 72 hours. Sepsis Labs: No results for input(s): PROCALCITON, LATICACIDVEN in the last 168 hours.  Recent Results (from the past 240 hour(s))  SARS CORONAVIRUS 2 (TAT 6-24 HRS) Nasopharyngeal Nasopharyngeal Swab     Status: None   Collection Time: 07/06/19  6:41 AM   Specimen: Nasopharyngeal  Swab  Result Value Ref Range Status   SARS Coronavirus 2 NEGATIVE NEGATIVE Final    Comment: (NOTE) SARS-CoV-2 target nucleic acids are NOT DETECTED. The SARS-CoV-2 RNA is generally detectable in upper and lower respiratory specimens during the acute phase of infection. Negative results do not preclude SARS-CoV-2 infection, do not rule out co-infections with other pathogens, and should not be used as the sole basis for treatment or other patient management decisions. Negative results must be combined with clinical observations, patient history, and epidemiological information. The expected result is Negative. Fact Sheet for Patients: SugarRoll.be Fact Sheet for Healthcare Providers: https://www.woods-mathews.com/ This test is not yet approved or cleared by the [place] FDA and  has been authorized for detection and/or diagnosis of SARS-CoV-2 by FDA under an Emergency Use Authorization (EUA). This EUA will remain  in effect (meaning this test can be used) for the duration of the COVID-19 declaration under Section 56 4(b)(1) of the Act, 21 U.S.C. section 360bbb-3(b)(1), unless the authorization is terminated or revoked sooner. Performed at [facility], [place] [address] [place], [place] [address]       Radiology Studies: No results found.   Scheduled Meds: . cholecalciferol  5,000 Units Oral Daily  . citalopram  40 mg Oral Daily  . enoxaparin (LOVENOX) injection  40 mg Subcutaneous Q24H  . mesalamine  1.2 g Oral Daily  . methocarbamol  500 mg Oral TID  . oxybutynin  5 mg Oral BID  . valACYclovir  500 mg Oral Daily   Continuous Infusions:   LOS: 2 days     Enzo Bi, MD [facility] If 7PM-7AM, please contact night-coverage 07/08/2019, 3:06 PM

## 2019-07-09 ENCOUNTER — Inpatient Hospital Stay: Payer: Medicare Other | Admitting: Anesthesiology

## 2019-07-09 ENCOUNTER — Encounter
Admission: EM | Disposition: A | Discharge status: Home Health | Payer: Self-pay | Source: Home / Self Care | Attending: Hospitalist

## 2019-07-09 ENCOUNTER — Inpatient Hospital Stay: Payer: Medicare Other

## 2019-07-09 HISTORY — PX: SACROPLASTY: SHX6797

## 2019-07-09 HISTORY — PX: OPEN REDUCTION INTERNAL FIXATION (ORIF) DISTAL RADIAL FRACTURE: SHX5989

## 2019-07-09 LAB — CBC
HCT: 39.6 % (ref 36.0–46.0)
Hemoglobin: 13.2 g/dL (ref 12.0–15.0)
MCH: 30.4 pg (ref 26.0–34.0)
MCHC: 33.3 g/dL (ref 30.0–36.0)
MCV: 91.2 fL (ref 80.0–100.0)
Platelets: 197 10*3/uL (ref 150–400)
RBC: 4.34 MIL/uL (ref 3.87–5.11)
RDW: 13.2 % (ref 11.5–15.5)
WBC: 7.9 10*3/uL (ref 4.0–10.5)
nRBC: 0 % (ref 0.0–0.2)

## 2019-07-09 LAB — BASIC METABOLIC PANEL
Anion gap: 6 (ref 5–15)
BUN: 17 mg/dL (ref 8–23)
CO2: 27 mmol/L (ref 22–32)
Calcium: 9.5 mg/dL (ref 8.9–10.3)
Chloride: 106 mmol/L (ref 98–111)
Creatinine, Ser: 0.68 mg/dL (ref 0.44–1.00)
GFR calc Af Amer: >60 mL/min (ref >60)
GFR calc non Af Amer: >60 mL/min (ref >60)
Glucose, Bld: 96 mg/dL (ref 70–99)
Potassium: 3.9 mmol/L (ref 3.5–5.1)
Sodium: 139 mmol/L (ref 135–145)

## 2019-07-09 LAB — MAGNESIUM: Magnesium: 2 mg/dL (ref 1.7–2.4)

## 2019-07-09 SURGERY — SACROPLASTY
Anesthesia: General

## 2019-07-09 MED ORDER — METOCLOPRAMIDE HCL 10 MG PO TABS: 5.0000 mg | ORAL_TABLET | Freq: Three times a day (TID) | ORAL | Status: DC | PRN

## 2019-07-09 MED ORDER — MAGNESIUM CITRATE PO SOLN
1.0000 | Freq: Once | ORAL | Status: DC | PRN
  Filled 2019-07-09: qty 296

## 2019-07-09 MED ORDER — ONDANSETRON HCL 4 MG PO TABS: 4.0000 mg | ORAL_TABLET | Freq: Four times a day (QID) | ORAL | Status: DC | PRN

## 2019-07-09 MED ORDER — DOCUSATE SODIUM 100 MG PO CAPS
100.0000 mg | ORAL_CAPSULE | Freq: Two times a day (BID) | ORAL | Status: DC
  Administered 2019-07-09 – 2019-07-10 (×2): 100 mg via ORAL
  Filled 2019-07-09 (×2): qty 1

## 2019-07-09 MED ORDER — ACETAMINOPHEN 10 MG/ML IV SOLN
INTRAVENOUS | Status: AC
  Filled 2019-07-09: qty 100

## 2019-07-09 MED ORDER — SUGAMMADEX SODIUM 200 MG/2ML IV SOLN
INTRAVENOUS | Status: DC | PRN
  Administered 2019-07-09: 125 mg via INTRAVENOUS

## 2019-07-09 MED ORDER — ACETAMINOPHEN 10 MG/ML IV SOLN
INTRAVENOUS | Status: DC | PRN
  Administered 2019-07-09: 1000 mg via INTRAVENOUS

## 2019-07-09 MED ORDER — ROCURONIUM BROMIDE 100 MG/10ML IV SOLN
INTRAVENOUS | Status: DC | PRN
  Administered 2019-07-09: 20 mg via INTRAVENOUS
  Administered 2019-07-09: 40 mg via INTRAVENOUS

## 2019-07-09 MED ORDER — PROPOFOL 10 MG/ML IV BOLUS
INTRAVENOUS | Status: DC | PRN
  Administered 2019-07-09: 100 mg via INTRAVENOUS

## 2019-07-09 MED ORDER — FENTANYL CITRATE (PF) 100 MCG/2ML IJ SOLN
INTRAMUSCULAR | Status: AC
  Filled 2019-07-09: qty 2

## 2019-07-09 MED ORDER — LIDOCAINE HCL (PF) 2 % IJ SOLN
INTRAMUSCULAR | Status: AC
  Filled 2019-07-09: qty 5

## 2019-07-09 MED ORDER — FENTANYL CITRATE (PF) 100 MCG/2ML IJ SOLN
INTRAMUSCULAR | Status: AC
  Administered 2019-07-09: 17:00:00 25 ug via INTRAVENOUS
  Filled 2019-07-09: qty 2

## 2019-07-09 MED ORDER — ESMOLOL HCL 100 MG/10ML IV SOLN
INTRAVENOUS | Status: DC | PRN
  Administered 2019-07-09: 25 mg via INTRAVENOUS

## 2019-07-09 MED ORDER — CEFAZOLIN SODIUM-DEXTROSE 1-4 GM/50ML-% IV SOLN
1.0000 g | Freq: Once | INTRAVENOUS | Status: AC
  Administered 2019-07-09: 1 g via INTRAVENOUS
  Filled 2019-07-09: qty 50

## 2019-07-09 MED ORDER — BUPIVACAINE-EPINEPHRINE (PF) 0.5% -1:200000 IJ SOLN
INTRAMUSCULAR | Status: DC | PRN
  Administered 2019-07-09: 20 mL

## 2019-07-09 MED ORDER — PHENYLEPHRINE HCL (PRESSORS) 10 MG/ML IV SOLN
INTRAVENOUS | Status: DC | PRN
  Administered 2019-07-09: 100 ug via INTRAVENOUS
  Administered 2019-07-09 (×2): 50 ug via INTRAVENOUS
  Administered 2019-07-09 (×4): 100 ug via INTRAVENOUS

## 2019-07-09 MED ORDER — NEOMYCIN-POLYMYXIN B GU 40-200000 IR SOLN
Status: DC | PRN
  Administered 2019-07-09: 2 mL

## 2019-07-09 MED ORDER — LIDOCAINE HCL (CARDIAC) PF 100 MG/5ML IV SOSY
PREFILLED_SYRINGE | INTRAVENOUS | Status: DC | PRN
  Administered 2019-07-09: 60 mg via INTRAVENOUS

## 2019-07-09 MED ORDER — LACTATED RINGERS IV SOLN: INTRAVENOUS | Status: DC | PRN

## 2019-07-09 MED ORDER — DEXAMETHASONE SODIUM PHOSPHATE 10 MG/ML IJ SOLN
INTRAMUSCULAR | Status: DC | PRN
  Administered 2019-07-09: 5 mg via INTRAVENOUS

## 2019-07-09 MED ORDER — LIDOCAINE HCL (PF) 1 % IJ SOLN
INTRAMUSCULAR | Status: DC | PRN
  Administered 2019-07-09: 20 mL

## 2019-07-09 MED ORDER — FENTANYL CITRATE (PF) 100 MCG/2ML IJ SOLN
INTRAMUSCULAR | Status: DC | PRN
  Administered 2019-07-09: 50 ug via INTRAVENOUS
  Administered 2019-07-09 (×2): 25 ug via INTRAVENOUS

## 2019-07-09 MED ORDER — FENTANYL CITRATE (PF) 100 MCG/2ML IJ SOLN
25.0000 ug | INTRAMUSCULAR | Status: DC | PRN
  Administered 2019-07-09 (×4): 25 ug via INTRAVENOUS

## 2019-07-09 MED ORDER — MAGNESIUM HYDROXIDE 400 MG/5ML PO SUSP: 30.0000 mL | Freq: Every day | ORAL | Status: DC | PRN

## 2019-07-09 MED ORDER — PROPOFOL 10 MG/ML IV BOLUS
INTRAVENOUS | Status: AC
  Filled 2019-07-09: qty 20

## 2019-07-09 MED ORDER — CEFAZOLIN SODIUM-DEXTROSE 1-4 GM/50ML-% IV SOLN
INTRAVENOUS | Status: AC
  Filled 2019-07-09: qty 50

## 2019-07-09 MED ORDER — BISACODYL 10 MG RE SUPP: 10.0000 mg | Freq: Every day | RECTAL | Status: DC | PRN

## 2019-07-09 MED ORDER — METOCLOPRAMIDE HCL 5 MG/ML IJ SOLN: 5.0000 mg | Freq: Three times a day (TID) | INTRAMUSCULAR | Status: DC | PRN

## 2019-07-09 MED ORDER — ESMOLOL HCL 100 MG/10ML IV SOLN
INTRAVENOUS | Status: AC
  Filled 2019-07-09: qty 10

## 2019-07-09 MED ORDER — ONDANSETRON HCL 4 MG/2ML IJ SOLN
INTRAMUSCULAR | Status: DC | PRN
  Administered 2019-07-09: 4 mg via INTRAVENOUS

## 2019-07-09 MED ORDER — ONDANSETRON HCL 4 MG/2ML IJ SOLN: 4.0000 mg | Freq: Once | INTRAMUSCULAR | Status: DC | PRN

## 2019-07-09 MED ORDER — DEXMEDETOMIDINE HCL 200 MCG/2ML IV SOLN
INTRAVENOUS | Status: DC | PRN
  Administered 2019-07-09: 8 ug via INTRAVENOUS
  Administered 2019-07-09: 12 ug via INTRAVENOUS

## 2019-07-09 MED ORDER — ONDANSETRON HCL 4 MG/2ML IJ SOLN: 4.0000 mg | Freq: Four times a day (QID) | INTRAMUSCULAR | Status: DC | PRN

## 2019-07-09 SURGICAL SUPPLY — 50 items
BNDG ELASTIC 4X5.8 VLCR STR LF (GAUZE/BANDAGES/DRESSINGS) ×4 IMPLANT
CANISTER SUCT 1200ML W/VALVE (MISCELLANEOUS) ×4 IMPLANT
CEMENT KYPHON CX01A KIT/MIXER (Cement) ×4 IMPLANT
CHLORAPREP W/TINT 26 (MISCELLANEOUS) ×4 IMPLANT
COVER WAND RF STERILE (DRAPES) ×4 IMPLANT
CUFF TOURN SGL QUICK 12 (TOURNIQUET CUFF) ×4 IMPLANT
CUFF TOURN SGL QUICK 18X4 (TOURNIQUET CUFF) IMPLANT
DERMABOND ADVANCED (GAUZE/BANDAGES/DRESSINGS) ×2
DERMABOND ADVANCED .7 DNX12 (GAUZE/BANDAGES/DRESSINGS) ×2 IMPLANT
DRAPE C-ARM XRAY 36X54 (DRAPES) ×4 IMPLANT
DRAPE FLUOR MINI C-ARM 54X84 (DRAPES) ×4 IMPLANT
DURAPREP 26ML APPLICATOR (WOUND CARE) ×4 IMPLANT
ELECT REM PT RETURN 9FT ADLT (ELECTROSURGICAL) ×4
ELECTRODE REM PT RTRN 9FT ADLT (ELECTROSURGICAL) ×2 IMPLANT
GAUZE SPONGE 4X4 12PLY STRL (GAUZE/BANDAGES/DRESSINGS) ×4 IMPLANT
GAUZE XEROFORM 1X8 LF (GAUZE/BANDAGES/DRESSINGS) ×8 IMPLANT
GLOVE SURG SYN 9.0  PF PI (GLOVE) ×2
GLOVE SURG SYN 9.0 PF PI (GLOVE) ×2 IMPLANT
GOWN SRG 2XL LVL 4 RGLN SLV (GOWNS) ×2 IMPLANT
GOWN STRL NON-REIN 2XL LVL4 (GOWNS) ×2
GOWN STRL REUS W/ TWL LRG LVL3 (GOWN DISPOSABLE) ×2 IMPLANT
GOWN STRL REUS W/TWL LRG LVL3 (GOWN DISPOSABLE) ×2
K-WIRE 1.6 (WIRE) ×2
K-WIRE FX5X1.6XNS BN SS (WIRE) ×2
KIT OSTEOCOOL BONE ACCESS 10G (MISCELLANEOUS) ×8 IMPLANT
KIT TURNOVER KIT A (KITS) ×4 IMPLANT
KWIRE FX5X1.6XNS BN SS (WIRE) ×2 IMPLANT
NEEDLE FILTER BLUNT 18X 1/2SAF (NEEDLE) ×2
NEEDLE FILTER BLUNT 18X1 1/2 (NEEDLE) ×2 IMPLANT
NS IRRIG 500ML POUR BTL (IV SOLUTION) ×4 IMPLANT
PACK EXTREMITY (MISCELLANEOUS) ×4 IMPLANT
PACK KYPHOPLASTY (MISCELLANEOUS) ×4 IMPLANT
PAD CAST CTTN 4X4 STRL (SOFTGOODS) ×4 IMPLANT
PADDING CAST COTTON 4X4 STRL (SOFTGOODS) ×4
PEG SUBCHONDRAL SMOOTH 2.0X14 (Peg) ×4 IMPLANT
PEG SUBCHONDRAL SMOOTH 2.0X18 (Peg) ×4 IMPLANT
PEG SUBCHONDRAL SMOOTH 2.0X20 (Peg) ×16 IMPLANT
PLATE STAN 24.4X59.5 RT (Plate) ×4 IMPLANT
SCALPEL PROTECTED #15 DISP (BLADE) ×8 IMPLANT
SCREW CORT 3.5X10 LNG (Screw) ×16 IMPLANT
SPLINT CAST 1 STEP 3X12 (MISCELLANEOUS) ×4 IMPLANT
SUT ETHILON 4-0 (SUTURE) ×2
SUT ETHILON 4-0 FS2 18XMFL BLK (SUTURE) ×2
SUT VICRYL 3-0 27IN (SUTURE) ×4 IMPLANT
SUTURE ETHLN 4-0 FS2 18XMF BLK (SUTURE) ×2 IMPLANT
SWABSTK COMLB BENZOIN TINCTURE (MISCELLANEOUS) ×4 IMPLANT
SYR 3ML LL SCALE MARK (SYRINGE) ×4 IMPLANT
SYS CARTRIDGE BONE CEMENT 8ML (SYSTAGENIX WOUND MANAGEMENT) ×4
SYSTEM CARTRIDG BONE CEMNT 8ML (SYSTAGENIX WOUND MANAGEMENT) ×2 IMPLANT
SYSTEM GUN BONE FILLER SZ2 (MISCELLANEOUS) ×4 IMPLANT

## 2019-07-09 NOTE — Care Management Important Message (Signed)
Important Message  Patient Details  Name: Monseratt Castleman MRN: [id number] Date of Birth: 07/10/1934   Medicare Important Message Given:  Yes     Juliann Pulse A Courtany Mcmurphy 07/09/2019, 11:09 AM

## 2019-07-09 NOTE — Progress Notes (Addendum)
PT Cancellation Note  Patient Details Name: Laura Morrow MRN: [id number] DOB: [birth year]   Cancelled Treatment:    Reason Eval/Treat Not Completed: Other (comment)   Pt offered session this am.  Stated she has been up and walked to bathroom with nursing staff.  Stated it was easier today but "I'm miserable."  She is awaiting surgery this pm for kyphoplasty and ORIF L wrist and requested to hold session for today.  Discussed discharge plan.  Original recommendations were for CIR.  Pt stated she lives at [facility] and prefers to go to their SNF after discharge.  Will update recommendations to reflect.   Chesley Noon 07/09/2019, 8:32 AM

## 2019-07-09 NOTE — Anesthesia Procedure Notes (Signed)
Procedure Name: Intubation Date/Time: 07/09/2019 3:19 PM Performed by: Lia Foyer, CRNA Pre-anesthesia Checklist: Patient identified, Emergency Drugs available, Suction available and Patient being monitored Patient Re-evaluated:Patient Re-evaluated prior to induction Oxygen Delivery Method: Circle system utilized Preoxygenation: Pre-oxygenation with 100% oxygen Induction Type: IV induction Ventilation: Mask ventilation without difficulty Laryngoscope Size: McGraph and 3 Grade View: Grade I Tube type: Oral Tube size: 7.0 mm Number of attempts: 1 Airway Equipment and Method: Stylet,  Oral airway and Video-laryngoscopy Placement Confirmation: ETT inserted through vocal cords under direct vision,  positive ETCO2 and breath sounds checked- equal and bilateral Secured at: 19 cm Tube secured with: Tape Dental Injury: Teeth and Oropharynx as per pre-operative assessment

## 2019-07-09 NOTE — Anesthesia Postprocedure Evaluation (Signed)
Anesthesia Post Note  Patient: Medea Muscato  Procedure(s) Performed: SACROPLASTY (N/A ) OPEN REDUCTION INTERNAL FIXATION (ORIF) DISTAL RADIAL FRACTURE (Left )  Patient location during evaluation: PACU Anesthesia Type: General Level of consciousness: awake and alert Pain management: pain level controlled Vital Signs Assessment: post-procedure vital signs reviewed and stable Respiratory status: spontaneous breathing, nonlabored ventilation, respiratory function stable and patient connected to nasal cannula oxygen Cardiovascular status: blood pressure returned to baseline and stable Postop Assessment: no apparent nausea or vomiting Anesthetic complications: no     Last Vitals:  Vitals:   07/09/19 1720 07/09/19 1729  BP:  140/79  Pulse: 82 85  Resp: 11 13  Temp:  36.8 C  SpO2: 96% 98%    Last Pain:  Vitals:   07/09/19 1729  TempSrc:   PainSc: Asleep                 Arita Miss

## 2019-07-09 NOTE — Progress Notes (Signed)
Patient continues to have significant low back pain.  After my initial consult her daughter did report that she been having some increasing pain in the area for the past month which would be consistent with insufficiency fracture.  Plan is lumbosacral kyphoplasty and ORIF of the left wrist later today.

## 2019-07-09 NOTE — Progress Notes (Signed)
PROGRESS NOTE    Laura Morrow  [id number] DOB: [birth year] DOA: 07/06/2019 PCP: [facility]    Assessment & Plan:   Principal Problem:   Intractable pain Active Problems:   Left wrist fracture   Accidental fall   Fracture of sacrum, closed ([place])   Anxiety    Laura Morrow is a 85 y.o. female with medical history significant for anxiety who presented to the emergency department for evaluation of that she had a fall.   # Mechanical fall resulting in # Sacral fracture with severe pain # Left distal radius fracture displaced S/p ORIF of left wrist and sacroplasty with Dr Rudene Christians today 07/09/19 --Norco PRN with IV morphine for breakthrough --PT/OT  Anxiety  Continue Citalopram   DVT prophylaxis: Lovenox SQ Code Status: Full code  Family Communication:  Disposition Plan: Likely home tomorrow after PT/OT   Subjective and Interval History:  Pt tolerated ortho surgeries well.  Afterwards, reported back "feeling good", still some arm pain.  No fever, dyspnea, chest pain, abdominal pain, N/V/D, dysuria.   Objective: Vitals:   07/09/19 1729 07/09/19 1755 07/09/19 1825 07/09/19 1936  BP: 140/79 (!) 144/80 134/75 131/86  Pulse: 85 83 73 74  Resp: 13   15  Temp: 98.2 F (36.8 C) 98.4 F (36.9 C) 98.7 F (37.1 C) 97.8 F (36.6 C)  TempSrc:  Oral Oral Oral  SpO2: 98% 96% 97% 97%  Weight:      Height:        Intake/Output Summary (Last 24 hours) at 07/09/2019 2009 Last data filed at 07/09/2019 1800 Gross per 24 hour  Intake 650 ml  Output 810 ml  Net -160 ml   Filed Weights   07/06/19 0316 07/09/19 1417  Weight: 62.6 kg 62 kg    Examination:   Constitutional: NAD, AAOx3 HEENT: conjunctivae and lids normal, EOMI CV: RRR no M,R,G. Distal pulses +2.  No cyanosis.   RESP: CTA B/L, normal respiratory effort  GI: +BS, NTND Extremities: No effusions, edema, or tenderness in BLE MSK: Left arm in ACE wrap SKIN: warm, dry and intact Neuro: II - XII  grossly intact.  Sensation intact Psych: Normal mood and affect.  Appropriate judgement and reason   Data Reviewed: I have personally reviewed following labs and imaging studies  CBC: Recent Labs  Lab 07/06/19 0351 07/08/19 0445 07/09/19 0401  WBC 14.7* 8.6 7.9  NEUTROABS 12.9*  --   --   HGB 14.0 13.9 13.2  HCT 42.4 42.1 39.6  MCV 91.6 91.7 91.2  PLT 234 216 [id number]   Basic Metabolic Panel: Recent Labs  Lab 07/06/19 0351 07/07/19 0446 07/08/19 0445 07/09/19 0401  NA 137 137 138 139  K 3.9 4.0 3.8 3.9  CL 104 104 104 106  CO2 23 25 25 27   GLUCOSE 130* 103* 97 96  BUN 18 13 13 17   CREATININE 1.00 0.55 0.57 0.68  CALCIUM 9.6 9.6 9.8 9.5  MG  --   --  2.2 2.0   GFR: Estimated Creatinine Clearance: 44.8 mL/min (by C-G formula based on SCr of 0.68 mg/dL). Liver Function Tests: Recent Labs  Lab 07/07/19 0446  AST 20  ALT 17  ALKPHOS 82  BILITOT 1.4*  PROT 6.2*  ALBUMIN 3.7   No results for input(s): LIPASE, AMYLASE in the last 168 hours. No results for input(s): AMMONIA in the last 168 hours. Coagulation Profile: No results for input(s): INR, PROTIME in the last 168 hours. Cardiac Enzymes: No results for input(s): CKTOTAL,  CKMB, CKMBINDEX, TROPONINI in the last 168 hours. BNP (last 3 results) No results for input(s): PROBNP in the last 8760 hours. HbA1C: No results for input(s): HGBA1C in the last 72 hours. CBG: No results for input(s): GLUCAP in the last 168 hours. Lipid Profile: No results for input(s): CHOL, HDL, LDLCALC, TRIG, CHOLHDL, LDLDIRECT in the last 72 hours. Thyroid Function Tests: No results for input(s): TSH, T4TOTAL, FREET4, T3FREE, THYROIDAB in the last 72 hours. Anemia Panel: No results for input(s): VITAMINB12, FOLATE, FERRITIN, TIBC, IRON, RETICCTPCT in the last 72 hours. Sepsis Labs: No results for input(s): PROCALCITON, LATICACIDVEN in the last 168 hours.  Recent Results (from the past 240 hour(s))  SARS CORONAVIRUS 2 (TAT 6-24 HRS)  Nasopharyngeal Nasopharyngeal Swab     Status: None   Collection Time: 07/06/19  6:41 AM   Specimen: Nasopharyngeal Swab  Result Value Ref Range Status   SARS Coronavirus 2 NEGATIVE NEGATIVE Final    Comment: (NOTE) SARS-CoV-2 target nucleic acids are NOT DETECTED. The SARS-CoV-2 RNA is generally detectable in upper and lower respiratory specimens during the acute phase of infection. Negative results do not preclude SARS-CoV-2 infection, do not rule out co-infections with other pathogens, and should not be used as the sole basis for treatment or other patient management decisions. Negative results must be combined with clinical observations, patient history, and epidemiological information. The expected result is Negative. Fact Sheet for Patients: SugarRoll.be Fact Sheet for Healthcare Providers: https://www.woods-mathews.com/ This test is not yet approved or cleared by the [place] FDA and  has been authorized for detection and/or diagnosis of SARS-CoV-2 by FDA under an Emergency Use Authorization (EUA). This EUA will remain  in effect (meaning this test can be used) for the duration of the COVID-19 declaration under Section 56 4(b)(1) of the Act, 21 U.S.C. section 360bbb-3(b)(1), unless the authorization is terminated or revoked sooner. Performed at [facility], [address], [place], [place] [address]       Radiology Studies: DG Lumbar Spine 2-3 Views  Result Date: 07/09/2019 CLINICAL DATA:  Intraoperative fluoroscopy, sacroplasty EXAM: LUMBAR SPINE - 2-3 VIEW; DG C-ARM 1-60 MIN COMPARISON:  CT lumbar spine, 07/06/2019 FLUOROSCOPY TIME:  Fluoroscopy Time:  1:24 Radiation Exposure Index (if provided by the fluoroscopic device): 0.652 mGy Number of Acquired Spot Images: 2 FINDINGS: Intraoperative fluoroscopic images are submitted for review demonstrating bilateral sacroplasty cement injection. IMPRESSION: Intraoperative  fluoroscopic images are submitted for review demonstrating bilateral sacroplasty cement injection. Electronically Signed   By: Eddie Candle M.D.   On: 07/09/2019 18:03   DG Wrist 2 Views Left  Result Date: 07/09/2019 CLINICAL DATA:  Postop EXAM: LEFT WRIST - 2 VIEW COMPARISON:  07/06/2019 FINDINGS: Plate and screw fixation in the distal left radius. Anatomic alignment. Distal ulnar fracture again noted, without significant displacement. No subluxation or dislocation. IMPRESSION: Internal fixation across the distal left radial fracture without visible complicating feature. Electronically Signed   By: Rolm Baptise M.D.   On: 07/09/2019 19:04   DG C-Arm 1-60 Min  Result Date: 07/09/2019 CLINICAL DATA:  Intraoperative fluoroscopy, sacroplasty EXAM: LUMBAR SPINE - 2-3 VIEW; DG C-ARM 1-60 MIN COMPARISON:  CT lumbar spine, 07/06/2019 FLUOROSCOPY TIME:  Fluoroscopy Time:  1:24 Radiation Exposure Index (if provided by the fluoroscopic device): 0.652 mGy Number of Acquired Spot Images: 2 FINDINGS: Intraoperative fluoroscopic images are submitted for review demonstrating bilateral sacroplasty cement injection. IMPRESSION: Intraoperative fluoroscopic images are submitted for review demonstrating bilateral sacroplasty cement injection. Electronically Signed   By:  Eddie Candle M.D.   On: 07/09/2019 18:03   DG MINI C-ARM IMAGE ONLY  Result Date: 07/09/2019 There is no interpretation for this exam.  This order is for images obtained during a surgical procedure.  Please See "Surgeries" Tab for more information regarding the procedure.     Scheduled Meds: . cholecalciferol  5,000 Units Oral Daily  . citalopram  40 mg Oral Daily  . docusate sodium  100 mg Oral BID  . mesalamine  1.2 g Oral Daily  . methocarbamol  500 mg Oral TID  . oxybutynin  5 mg Oral BID  . valACYclovir  500 mg Oral Daily   Continuous Infusions:   LOS: 3 days     Enzo Bi, MD [facility] If 7PM-7AM, please contact  night-coverage 07/09/2019, 8:09 PM

## 2019-07-09 NOTE — Op Note (Signed)
07/09/2019  4:39 PM  PATIENT:  Laura Morrow  85 y.o. female  PRE-OPERATIVE DIAGNOSIS:  L wrist fracture and sacral fracture  POST-OPERATIVE DIAGNOSIS:  L wrist extra-articular fracture and sacral fracture  PROCEDURE:  Procedure(s): SACROPLASTY (N/A) OPEN REDUCTION INTERNAL FIXATION (ORIF) DISTAL RADIAL FRACTURE (Left)  SURGEON: Laurene Footman, MD  ASSISTANTS: None  ANESTHESIA:   general  EBL:  Total I/O In: 600 [I.V.:600] Out: 10 [Blood:10]  BLOOD ADMINISTERED:none  DRAINS: none   LOCAL MEDICATIONS USED:  MARCAINE    and XYLOCAINE   SPECIMEN:  No Specimen  DISPOSITION OF SPECIMEN:  N/A  COUNTS:  YES  TOURNIQUET:   Total Tourniquet Time Documented: Upper Arm (Left) - 17 minutes Total: Upper Arm (Left) - 17 minutes   IMPLANTS: Bone cement for the sacrum and narrow standard DVR plate with multiple screws and smooth pegs   DICTATION: .Dragon Dictation   patient was brought to the operating room and after adequate normal anesthesia was obtained was given the patient was placed prone.  C arm was brought in in both AP and lateral projections with good visualization of the sacrum and SI joints bilaterally.  The back was then prepped and draped in the usual sterile fashion and the timeout procedure with patient identification carried out.  A spinal needle was used to get down to the sacrum in the appropriate position on both AP and lateral projections and a half 1% Xylocaine half 0.5% Sensorcaine with epinephrine was infiltrated to the area of both incisions with a total of 20 cc getting down to the bone.  After this was allowed to set a small incision was made and trocar advanced into the sacrum first on the left than the right.  At this point the cement had been next and was appropriate consistency and approximately 3 cc used to fill on the right additional 4 on the left without extravasation into the SI joint or sacral foramina.  After allowing this to set the trochars were  removed and the wounds closed with Dermabond followed by Band-Aid.  The patient was then rolled onto a stretcher and slid back onto the table with a pillow was removed from positioning for the sacral plasty for the upper extremity surgery.  An arm board was applied to allow for traction and the left arm was then prepped and draped in the usual sterile fashion with a tourniquet by the upper arm.  After prepping and draping and repeat timeout procedure was carried out.  The tourniquet was raised to 250 mmHg and a volar incision was made centered over the FCR tendon.  The tendon sheath was incised and the tendon retracted radially protect the radial artery and associated veins.  The muscle was then elevated off the distal shaft and distal fragment with extra-articular fracture being present.  Traction was applied to the start of the case through finger traps and this helped restore length which is slight dorsal angulation of the distal fragment.  The standard length plate with the 6 smooth pegs distally was placed first distal first approach bringing the plate down this gave anatomical reduction with restoration of length volar tilt and radial inclination of the distal radius for 10 mm screws were placed in the shaft.  The wound was irrigated and tourniquet let down followed by wound closure with 3-0 Vicryl subcutaneously and 4-0 nylon in a simple interrupted fashion.  Xeroform 4 x 4 web roll and a volar splint was applied followed by an Ace wrap.  PLAN OF CARE: Continue as inpatient  PATIENT DISPOSITION:  PACU - hemodynamically stable.

## 2019-07-09 NOTE — Transfer of Care (Signed)
Immediate Anesthesia Transfer of Care Note  Patient: Laura Morrow  Procedure(s) Performed: SACROPLASTY (N/A ) OPEN REDUCTION INTERNAL FIXATION (ORIF) DISTAL RADIAL FRACTURE (Left )  Patient Location: PACU  Anesthesia Type:General  Level of Consciousness: drowsy  Airway & Oxygen Therapy: Patient Spontanous Breathing and Patient connected to face mask oxygen  Post-op Assessment: Report given to RN, Vital signs reviewed and stable  Post vital signs: Reviewed and stable  Last Vitals:  Vitals Value Taken Time  BP 155/97 07/09/19 1645  Temp 36.5 C 07/09/19 1645  Pulse 94 07/09/19 1651  Resp 19 07/09/19 1651  SpO2 99 % 07/09/19 1651  Vitals shown include unvalidated device data.  Last Pain:  Vitals:   07/09/19 1417  TempSrc: Temporal  PainSc: 5          Complications: No apparent anesthesia complications

## 2019-07-10 LAB — BASIC METABOLIC PANEL
Anion gap: 9 (ref 5–15)
BUN: 17 mg/dL (ref 8–23)
CO2: 23 mmol/L (ref 22–32)
Calcium: 9.5 mg/dL (ref 8.9–10.3)
Chloride: 105 mmol/L (ref 98–111)
Creatinine, Ser: 0.48 mg/dL (ref 0.44–1.00)
GFR calc Af Amer: >60 mL/min (ref >60)
GFR calc non Af Amer: >60 mL/min (ref >60)
Glucose, Bld: 113 mg/dL — ABNORMAL HIGH (ref 70–99)
Potassium: 4.4 mmol/L (ref 3.5–5.1)
Sodium: 137 mmol/L (ref 135–145)

## 2019-07-10 LAB — CBC
HCT: 40 % (ref 36.0–46.0)
Hemoglobin: 13.7 g/dL (ref 12.0–15.0)
MCH: 30.2 pg (ref 26.0–34.0)
MCHC: 34.3 g/dL (ref 30.0–36.0)
MCV: 88.3 fL (ref 80.0–100.0)
Platelets: 229 10*3/uL (ref 150–400)
RBC: 4.53 MIL/uL (ref 3.87–5.11)
RDW: 13.2 % (ref 11.5–15.5)
WBC: 8.5 10*3/uL (ref 4.0–10.5)
nRBC: 0 % (ref 0.0–0.2)

## 2019-07-10 LAB — MAGNESIUM: Magnesium: 2.3 mg/dL (ref 1.7–2.4)

## 2019-07-10 NOTE — Progress Notes (Signed)
OT Cancellation Note  Patient Details Name: Laura Morrow MRN: [id number] DOB: 1934/07/07   Cancelled Treatment:    Reason Eval/Treat Not Completed: Patient at procedure or test/ unavailable  Pt working with physical therapy at this time. S/p L wrist ORIF and sacroplasty yesterday. Will f/u for occupational therapy Re-Evaluation as able. Thank you.  Gerrianne Scale, [place], OTR/L ascom [phone] 07/10/19, 9:26 AM

## 2019-07-10 NOTE — Evaluation (Signed)
Occupational Therapy Re-Evaluation Patient Details Name: Laura Morrow MRN: [id number] DOB: [birth year] Today's Date: 07/10/2019    History of Present Illness Laura Morrow is an 19yoF who sustained a fall at home resulting in displaced, Left Colles' fracture (preoperative) and Left sacral ala fracture (preoperative). S/p sacroplasty and L wrist ORIF (07/09/19)   Clinical Impression   Pt was seen for OT Re-evaluation this date. Prior to hospital admission, pt was Indep with all ADLs/IADLs including driving. Pt lives alone in [address] [place] apartment. Currently pt demonstrates impairments as described below (See OT problem list) which functionally limit her ability to perform ADL/self-care tasks. Pt currently requires CGA to SBA with ADL transfers with L platform RW and requires MIN A with LB ADLs requiring education re: modification/use of adaptive equipment.  Pt would benefit from skilled OT to address noted impairments and functional limitations (see below for any additional details) in order to maximize safety and independence while minimizing falls risk and caregiver burden. Upon hospital discharge, recommend HHOT to maximize pt safety and return to functional independence during meaningful occupations of daily life.     Follow Up Recommendations  Home health OT;Supervision - Intermittent    Equipment Recommendations  3 in 1 bedside commode;Tub/shower seat;Other (comment)(2WW with platform, anticipate need for 5" wheels d/t carpet and addition of sliders on back end.)    Recommendations for Other Services       Precautions / Restrictions Precautions Precautions: Fall Restrictions Weight Bearing Restrictions: Yes LUE Weight Bearing: Weight bear through elbow only LLE Weight Bearing: Weight bearing as tolerated      Mobility Bed Mobility               General bed mobility comments: pt up to chair when OT presents  Transfers Overall transfer level: Needs  assistance Equipment used: Left platform walker Transfers: Sit to/from Stand Sit to Stand: Supervision         General transfer comment: MIN verbal cues to reach back with R UE to control descent for stand to sit    Balance Overall balance assessment: Needs assistance Sitting-balance support: No upper extremity supported;Feet supported Sitting balance-Leahy Scale: Good     Standing balance support: Single extremity supported;During functional activity Standing balance-Leahy Scale: Good Standing balance comment: no physcial assist for balance in static standing, does require UE support.                           ADL either performed or assessed with clinical judgement   ADL                                         General ADL Comments: Pt requires SBA with standing grooming tasks. Able to perform UB dressing with supv with modified technique (hemi dressing, educated by OT), requires MIN A with use of AE for LB dressing. Able to perform ADL transfers with RW with platform with CGA initially, then with SBA with platform RW on subsequent trials.     Vision Baseline Vision/History: Wears glasses Wears Glasses: At all times Patient Visual Report: No change from baseline       Perception     Praxis      Pertinent Vitals/Pain Pain Assessment: 0-10 Pain Score: 2  Pain Location: low back Pain Descriptors / Indicators: Aching Pain Intervention(s): Limited activity within patient's tolerance;Monitored during session  Hand Dominance Right   Extremity/Trunk Assessment Upper Extremity Assessment Upper Extremity Assessment: RUE deficits/detail;LUE deficits/detail RUE Deficits / Details: WFL, pt is R handed LUE Deficits / Details: Pt with ace wrap to L wrist from MCPs to elbow. Able to flex/ext digits some, but somewhat limited d/t edema. Elbow and shoulder ROM WFL.   Lower Extremity Assessment Lower Extremity Assessment: Defer to PT  evaluation;Overall WFL for tasks assessed       Communication Communication Communication: No difficulties   Cognition Arousal/Alertness: Awake/alert Behavior During Therapy: WFL for tasks assessed/performed Overall Cognitive Status: Within Functional Limits for tasks assessed                                 General Comments: A&O, HOH, does require some repetition.   General Comments       Exercises Other Exercises Other Exercises: OT facilitaes education re: role of OT in acute setting including self care task modification. Pt demos good understanding. Pt's daughter is present throughout and receptive to education.   Shoulder Instructions      Home Living Family/patient expects to be discharged to:: Private residence Living Arrangements: Alone Available Help at Discharge: Family Type of Home: Independent living facility Home Access: Level entry     Home Layout: One level     Bathroom Shower/Tub: Astronomer Accessibility: Yes How Accessible: Accessible via walker Home Equipment: Wheelchair - manual;Walker - 4 wheels;Grab bars - tub/shower   Additional Comments: w/c and 4WW available from DTR if needed      Prior Functioning/Environment Level of Independence: Independent        Comments: still drives, no use of AD for fxl mobility, gets groceries, cooks meals, drives to friends' apartments for card games. Pt with some hx limiting bilat knee pain.  Denies additional fall history in previous 6 months.        OT Problem List: Decreased strength;Decreased range of motion;Decreased knowledge of use of DME or AE;Decreased knowledge of precautions;Impaired UE functional use      OT Treatment/Interventions: Self-care/ADL training;Therapeutic exercise;Energy conservation;DME and/or AE instruction;Therapeutic activities;Patient/family education;Balance training    OT Goals(Current goals can be found in the care plan section) Acute Rehab OT  Goals Patient Stated Goal: to be able to get home and thrive. OT Goal Formulation: With patient/family Time For Goal Achievement: 07/24/19 Potential to Achieve Goals: Good  OT Frequency: Min 2X/week   Barriers to D/C: Decreased caregiver support          Co-evaluation              AM-PAC OT "6 Clicks" Daily Activity     Outcome Measure Help from another person eating meals?: A Little Help from another person taking care of personal grooming?: A Little Help from another person toileting, which includes using toliet, bedpan, or urinal?: A Little Help from another person bathing (including washing, rinsing, drying)?: A Lot Help from another person to put on and taking off regular upper body clothing?: A Little Help from another person to put on and taking off regular lower body clothing?: A Lot 6 Click Score: 16   End of Session Equipment Utilized During Treatment: Gait belt;Other (comment)(L platform walker) Nurse Communication: Mobility status  Activity Tolerance: Patient tolerated treatment well Patient left: in chair;with call bell/phone within reach;with chair alarm set;with family/visitor present  OT Visit Diagnosis: Unsteadiness on feet (R26.81);Other abnormalities of gait and mobility (  R26.89)                [id number] OT Time Calculation (min): 58 min Charges:  OT General Charges $OT Visit: 1 Visit OT Evaluation $OT Re-eval: 1 Re-eval OT Treatments $Self Care/Home Management : 23-37 mins $Therapeutic Activity: 8-22 mins  Gerrianne Scale, MS, OTR/L ascom [phone] 07/10/19, 3:15 PM

## 2019-07-10 NOTE — Evaluation (Signed)
Physical Therapy Evaluation Patient Details Name: Laura Morrow MRN: [id number] DOB: [birth year] Today's Date: 07/10/2019   History of Present Illness  Laura Morrow is an 53yoF who sustained a fall at home, not s/p Left Colles fracture (preoperative) and Left sacral ala fracture (preoperative). S/p sacroplasty and L wrist ORIF (07/09/19)  Clinical Impression  Patient with marked improvement in pain control and overall functional performance, activity tolerance post-procedures (sacroplasty, L wrist ORIF).  Able to complete all activities with L PFRW, cga/close sup; good awareness of safety needs and limits of stability.  Good use of compensatory strategies and good adherence to L wrist WBing restrictions throughout session.  Patient very pleased/encouraged with progress and hopeful for return home at discharge.  Given noted improvement, discharge recommendations updated to reflect. Would benefit from skilled PT to address above deficits and promote optimal return to PLOF.; Recommend transition to HHPT upon discharge from acute hospitalization.     Follow Up Recommendations Home health PT(HHOT, HHRN, HHaide)    Equipment Recommendations       Recommendations for Other Services       Precautions / Restrictions Precautions Precautions: Fall Restrictions Weight Bearing Restrictions: Yes LUE Weight Bearing: Weight bear through elbow only LLE Weight Bearing: Weight bearing as tolerated      Mobility  Bed Mobility Overal bed mobility: Modified Independent                Transfers Overall transfer level: Needs assistance Equipment used: Left platform walker Transfers: Sit to/from Stand Sit to Stand: Supervision            Ambulation/Gait Ambulation/Gait assistance: Supervision Gait Distance (Feet): 175 Feet Assistive device: Left platform walker       General Gait Details: reciprocal steppin pattern with good step height/length; slow, guarded cadence, but no overt  buckling or LOB.  Endorses "feeling good and steady" with L PFRW  Stairs            Wheelchair Mobility    Modified Rankin (Stroke Patients Only)       Balance Overall balance assessment: Needs assistance Sitting-balance support: No upper extremity supported;Feet supported         Standing balance-Leahy Scale: Good                               Pertinent Vitals/Pain Pain Assessment: 0-10 Pain Score: 2  Pain Location: low back Pain Descriptors / Indicators: Aching Pain Intervention(s): Limited activity within patient's tolerance;Monitored during session;Repositioned    Home Living Family/patient expects to be discharged to:: Private residence Living Arrangements: Alone Available Help at Discharge: Family Type of Home: Independent living facility Home Access: Level entry     Home Layout: One level Home Equipment: Wheelchair - [employer] - 4 wheels;Grab bars - tub/shower Additional Comments: w/c and 4WW available from DTR if needed    Prior Function Level of Independence: Independent         Comments: still drives, makes groceries; has limiting bilat knee pain.  Denies additional fall history in previous 6 months     Hand Dominance   Dominant Hand: Right    Extremity/Trunk Assessment   Upper Extremity Assessment Upper Extremity Assessment: (L wrist with post-op splint/ace wrap in place, mod edema to digits but able to flex/ext appropriately, L elbow and shoulder WFL; R UE grossly WFL)    Lower Extremity Assessment Lower Extremity Assessment: (grossly at least 4/5 throughout)  Communication   Communication: No difficulties  Cognition Arousal/Alertness: Awake/alert Behavior During Therapy: WFL for tasks assessed/performed Overall Cognitive Status: Within Functional Limits for tasks assessed                                        General Comments      Exercises Other Exercises Other Exercises: Upper/lower  body bathing and dressing routing, standing at sink for sponge bath, set up/sup.  Good safety awareness and insight into limits of stability; good use of compensatory strategies, good adherence to NWB L wrist with functional activities Other Exercises: 27' with R loftstrand, min assist-very choppy, hesitant gait pattern; patient generally uncomfortable with device, much preferring L PFRW   Assessment/Plan    PT Assessment Patient needs continued PT services  PT Problem List Decreased strength;Decreased range of motion;Decreased activity tolerance;Decreased mobility;Decreased knowledge of precautions;Decreased safety awareness       PT Treatment Interventions DME instruction;Gait training;Functional mobility training;Stair training;Therapeutic activities;Therapeutic exercise;Wheelchair mobility training;Balance training;Patient/family education    PT Goals (Current goals can be found in the Care Plan section)  Acute Rehab PT Goals Patient Stated Goal: resolve pain, return to home AMB PT Goal Formulation: With patient Time For Goal Achievement: 07/24/19 Potential to Achieve Goals: Good    Frequency 7X/week   Barriers to discharge        Co-evaluation               AM-PAC PT "6 Clicks" Mobility  Outcome Measure Help needed turning from your back to your side while in a flat bed without using bedrails?: None Help needed moving from lying on your back to sitting on the side of a flat bed without using bedrails?: None Help needed moving to and from a bed to a chair (including a wheelchair)?: None Help needed standing up from a chair using your arms (e.g., wheelchair or bedside chair)?: None Help needed to walk in hospital room?: None Help needed climbing 3-5 steps with a railing? : A Little 6 Click Score: 23    End of Session Equipment Utilized During Treatment: Gait belt Activity Tolerance: Patient tolerated treatment well Patient left: with call bell/phone within reach;in  chair;with chair alarm set Nurse Communication: Mobility status PT Visit Diagnosis: Unsteadiness on feet (R26.81);Other abnormalities of gait and mobility (R26.89);Difficulty in walking, not elsewhere classified (R26.2);History of falling (Z91.81);Muscle weakness (generalized) (M62.81)    Time: 0920-1000 PT Time Calculation (min) (ACUTE ONLY): 40 min   Charges:   PT Evaluation $PT Re-evaluation: 1 Re-eval PT Treatments $Gait Training: 8-22 mins $Therapeutic Activity: 8-22 mins       Chey Rachels H. Owens Shark, PT, DPT, NCS 07/10/19, 10:17 AM [phone]

## 2019-07-10 NOTE — Discharge Summary (Signed)
Physician Discharge Summary   Laura Morrow  female DOB: Sep 07, 1934  [id number]  PCP: [place] date: 07/06/2019 Discharge date: 07/10/2019  Admitted From: home Disposition:  home Home Health: Yes CODE STATUS: Full code     Hospital Course:  For full details, please see H&P, progress notes, consult notes and ancillary notes.  Briefly,  Laura Cocozzais a 85 y.o.femalewith medical history significant foranxiety who presented to the emergency department for evaluation of that she had a fall.   # Mechanical fall resulting in # Sacral fracture with severe pain # Left distal radius fracture displaced S/p ORIF of left wrist and sacroplasty with Dr Rudene Christians on 07/09/19 Pain much improved after the surgeries.  Pt is discharged with HHPT/OT.  Pt will follow up with Dr. Rudene Christians on 07/13/2019.  Anxiety  Continued Citalopram   Discharge Diagnoses:  Principal Problem:   Intractable pain Active Problems:   Left wrist fracture   Accidental fall   Fracture of sacrum, closed [facility]   Anxiety    Discharge Instructions:  Allergies as of 07/10/2019      Reactions   Sulfa Antibiotics Swelling      Medication List    TAKE these medications   citalopram 40 MG tablet Commonly known as: CELEXA Take 1 tablet by mouth daily.   Cranberry Extract 250 MG Tabs Take 500 mg by mouth daily.   mesalamine 1.2 g EC tablet Commonly known as: LIALDA Take 1.2 g by mouth daily.   naproxen sodium 220 MG tablet Commonly known as: ALEVE Take 220 mg by mouth 2 (two) times daily as needed.   oxybutynin 5 MG tablet Commonly known as: DITROPAN Take 5 mg by mouth 2 (two) times daily.   valACYclovir 500 MG tablet Commonly known as: VALTREX Take 500 mg by mouth daily.   Vitamin D3 125 MCG (5000 UT) Caps Take 5,000 Units by mouth daily.            Durable Medical Equipment  (From admission, onward)         Start     Ordered   07/10/19 1242  For home use only  DME Walker rolling  Once    Comments: Left arm platform needed  Question Answer Comment  Walker: With [place] Wheels   Patient needs a walker to treat with the following condition Weakness      07/10/19 1241          Follow-up Information    Hessie Knows, MD. Schedule an appointment as soon as possible for a visit on 07/13/2019.   Specialty: Orthopedic Surgery Why: follow up. Contact information: [place] [address] [place]. Schedule an appointment as soon as possible for a visit in 1 week(s).   Contact information: [place] [address] [phone]           Allergies  Allergen Reactions  . Sulfa Antibiotics Swelling     The results of significant diagnostics from this hospitalization (including imaging, microbiology, ancillary and laboratory) are listed below for reference.   Consultations:   Procedures/Studies: DG Lumbar Spine 2-3 Views  Result Date: 07/09/2019 CLINICAL DATA:  Intraoperative fluoroscopy, sacroplasty EXAM: LUMBAR SPINE - 2-3 VIEW; DG C-ARM 1-60 MIN COMPARISON:  CT lumbar spine, 07/06/2019 FLUOROSCOPY TIME:  Fluoroscopy Time:  1:24 Radiation Exposure Index (if provided by the fluoroscopic device): 0.652 mGy Number of Acquired Spot  Images: 2 FINDINGS: Intraoperative fluoroscopic images are submitted for review demonstrating bilateral sacroplasty cement injection. IMPRESSION: Intraoperative fluoroscopic images are submitted for review demonstrating bilateral sacroplasty cement injection. Electronically Signed   By: Eddie Candle M.D.   On: 07/09/2019 18:03   DG Elbow Complete Right  Result Date: 07/06/2019 CLINICAL DATA:  Left wrist deformity with skin tear. EXAM: RIGHT ELBOW - COMPLETE 3+ VIEW COMPARISON:  None. FINDINGS: There is no evidence of fracture, dislocation, or joint effusion. No opaque foreign body. Generalized osteopenic appearance  IMPRESSION: No acute finding. Electronically Signed   By: Monte Fantasia M.D.   On: 07/06/2019 04:41   DG Wrist 2 Views Left  Result Date: 07/09/2019 CLINICAL DATA:  Postop EXAM: LEFT WRIST - 2 VIEW COMPARISON:  07/06/2019 FINDINGS: Plate and screw fixation in the distal left radius. Anatomic alignment. Distal ulnar fracture again noted, without significant displacement. No subluxation or dislocation. IMPRESSION: Internal fixation across the distal left radial fracture without visible complicating feature. Electronically Signed   By: Rolm Baptise M.D.   On: 07/09/2019 19:04   DG Wrist Complete Left  Result Date: 07/06/2019 CLINICAL DATA:  Fall with wrist deformity EXAM: LEFT WRIST - COMPLETE 3+ VIEW COMPARISON:  None. FINDINGS: Acute primarily transverse fracture through the distal radial metaphysis with mainly medial sided impaction. There is a branching fracture line extending longitudinally into the proximal diaphysis. Acute fracture at the metaphysis and styloid process of the ulna, nondisplaced. Regional soft tissue swelling. A true lateral view could not be obtained, there is gross alignment at the wrist joint. Advanced first Hadar and STT osteoarthritis. IMPRESSION: Distal radius and ulnar fractures with radial impaction. Electronically Signed   By: Monte Fantasia M.D.   On: 07/06/2019 04:42   CT Head Wo Contrast  Result Date: 07/06/2019 CLINICAL DATA:  Fall with wrist fracture. EXAM: CT HEAD WITHOUT CONTRAST TECHNIQUE: Contiguous axial images were obtained from the base of the skull through the vertex without intravenous contrast. COMPARISON:  None. FINDINGS: Brain: No evidence of acute infarction, hemorrhage, hydrocephalus, extra-axial collection or mass lesion/mass effect. Chronic small vessel ischemia in the deep cerebral white matter with lacunar infarct in the right thalamus that is chronic based on history. Vascular: Atherosclerotic calcification. Skull: Negative for fracture  Sinuses/Orbits: No evidence of injury IMPRESSION: No evidence of intracranial injury. Electronically Signed   By: Monte Fantasia M.D.   On: 07/06/2019 04:51   CT Lumbar Spine Wo Contrast  Result Date: 07/06/2019 CLINICAL DATA:  Fall with left hip and lower back pain EXAM: CT LUMBAR SPINE WITHOUT CONTRAST TECHNIQUE: Multidetector CT imaging of the lumbar spine was performed without intravenous contrast administration. Multiplanar CT image reconstructions were also generated. COMPARISON:  None. FINDINGS: Segmentation: 5 lumbar type vertebrae based on the lowest ribs. Alignment: Slight anterolisthesis at L5-S1 Vertebrae: Small cortex fracture involving the lateral left sacral ala. No displacement or sacral foramina involvement. No vertebral fracture noted. Osteopenia. Paraspinal and other soft tissues: Diffuse atherosclerotic plaque. No acute soft tissue finding. Disc levels: Aside from L5-S1 there is relatively preserved disc height. Generalized degenerative facet spurring. There is up to moderate spinal stenosis at L3-4. IMPRESSION: Acute, nondisplaced left sacral ala fracture. Electronically Signed   By: Monte Fantasia M.D.   On: 07/06/2019 06:12   CT Hip Left Wo Contrast  Result Date: 07/06/2019 CLINICAL DATA:  85 year old female with history of trauma from a fall complaining of left hip and low back pain. EXAM: CT OF THE LEFT HIP WITHOUT CONTRAST TECHNIQUE: Multidetector  CT imaging of the left hip was performed according to the standard protocol. Multiplanar CT image reconstructions were also generated. COMPARISON:  No priors. FINDINGS: Bones/Joint/Cartilage No acute displaced fracture noted. Left femoral head is located. There is joint space narrowing, subchondral sclerosis, subchondral cyst formation and osteophyte formation in the left hip joint, compatible with moderate osteoarthritis. Ligaments Suboptimally assessed by CT. Muscles and Tendons No acute abnormality noted. Soft tissues No unexpected  fluid collection or inflammatory changes. Atherosclerotic calcification in the visualized vasculature. IMPRESSION: 1. No acute radiographic abnormality of the left hip. 2. Moderate left hip joint osteoarthritis. 3. Atherosclerosis. Electronically Signed   By: Vinnie Langton M.D.   On: 07/06/2019 05:56   DG C-Arm 1-60 Min  Result Date: 07/09/2019 CLINICAL DATA:  Intraoperative fluoroscopy, sacroplasty EXAM: LUMBAR SPINE - 2-3 VIEW; DG C-ARM 1-60 MIN COMPARISON:  CT lumbar spine, 07/06/2019 FLUOROSCOPY TIME:  Fluoroscopy Time:  1:24 Radiation Exposure Index (if provided by the fluoroscopic device): 0.652 mGy Number of Acquired Spot Images: 2 FINDINGS: Intraoperative fluoroscopic images are submitted for review demonstrating bilateral sacroplasty cement injection. IMPRESSION: Intraoperative fluoroscopic images are submitted for review demonstrating bilateral sacroplasty cement injection. Electronically Signed   By: Eddie Candle M.D.   On: 07/09/2019 18:03   DG MINI C-ARM IMAGE ONLY  Result Date: 07/09/2019 There is no interpretation for this exam.  This order is for images obtained during a surgical procedure.  Please See "Surgeries" Tab for more information regarding the procedure.   DG Hip Unilat With Pelvis 2-3 Views Left  Result Date: 07/06/2019 CLINICAL DATA:  Fall with left hip pain EXAM: DG HIP (WITH OR WITHOUT PELVIS) 2-3V LEFT COMPARISON:  None. FINDINGS: No evidence of hip or pelvic ring fracture. Both hips are located. Generalized osteopenia. Atherosclerotic calcification IMPRESSION: No acute finding. Electronically Signed   By: Monte Fantasia M.D.   On: 07/06/2019 04:40      Labs: BNP (last 3 results) No results for input(s): BNP in the last 8760 hours. Basic Metabolic Panel: Recent Labs  Lab 07/06/19 0351 07/07/19 0446 07/08/19 0445 07/09/19 0401 07/10/19 0424  NA 137 137 138 139 137  K 3.9 4.0 3.8 3.9 4.4  CL 104 104 104 106 105  CO2 23 25 25 27 23   GLUCOSE 130* 103* 97  96 113*  BUN 18 13 13 17 17   CREATININE 1.00 0.55 0.57 0.68 0.48  CALCIUM 9.6 9.6 9.8 9.5 9.5  MG  --   --  2.2 2.0 2.3   Liver Function Tests: Recent Labs  Lab 07/07/19 0446  AST 20  ALT 17  ALKPHOS 82  BILITOT 1.4*  PROT 6.2*  ALBUMIN 3.7   No results for input(s): LIPASE, AMYLASE in the last 168 hours. No results for input(s): AMMONIA in the last 168 hours. CBC: Recent Labs  Lab 07/06/19 0351 07/08/19 0445 07/09/19 0401 07/10/19 0424  WBC 14.7* 8.6 7.9 8.5  NEUTROABS 12.9*  --   --   --   HGB 14.0 13.9 13.2 13.7  HCT 42.4 42.1 39.6 40.0  MCV 91.6 91.7 91.2 88.3  PLT 234 216 197 229   Cardiac Enzymes: No results for input(s): CKTOTAL, CKMB, CKMBINDEX, TROPONINI in the last 168 hours. BNP: Invalid input(s): POCBNP CBG: No results for input(s): GLUCAP in the last 168 hours. D-Dimer No results for input(s): DDIMER in the last 72 hours. Hgb A1c No results for input(s): HGBA1C in the last 72 hours. Lipid Profile No results for input(s): CHOL, HDL, LDLCALC, TRIG,  CHOLHDL, LDLDIRECT in the last 72 hours. Thyroid function studies No results for input(s): TSH, T4TOTAL, T3FREE, THYROIDAB in the last 72 hours.  Invalid input(s): FREET3 Anemia work up No results for input(s): VITAMINB12, FOLATE, FERRITIN, TIBC, IRON, RETICCTPCT in the last 72 hours. Urinalysis No results found for: COLORURINE, APPEARANCEUR, [place], [place], [place], [place], [place], [place], PROTEINUR, UROBILINOGEN, NITRITE, LEUKOCYTESUR Sepsis Labs Invalid input(s): PROCALCITONIN,  WBC,  LACTICIDVEN Microbiology Recent Results (from the past 240 hour(s))  SARS CORONAVIRUS 2 (TAT 6-24 HRS) Nasopharyngeal Nasopharyngeal Swab     Status: None   Collection Time: 07/06/19  6:41 AM   Specimen: Nasopharyngeal Swab  Result Value Ref Range Status   SARS Coronavirus 2 NEGATIVE NEGATIVE Final    Comment: (NOTE) SARS-CoV-2 target nucleic acids are NOT DETECTED. The SARS-CoV-2 RNA is generally  detectable in upper and lower respiratory specimens during the acute phase of infection. Negative results do not preclude SARS-CoV-2 infection, do not rule out co-infections with other pathogens, and should not be used as the sole basis for treatment or other patient management decisions. Negative results must be combined with clinical observations, patient history, and epidemiological information. The expected result is Negative. Fact Sheet for Patients: SugarRoll.be Fact Sheet for Healthcare Providers: https://www.woods-mathews.com/ This test is not yet approved or cleared by the [place] FDA and  has been authorized for detection and/or diagnosis of SARS-CoV-2 by FDA under an Emergency Use Authorization (EUA). This EUA will remain  in effect (meaning this test can be used) for the duration of the COVID-19 declaration under Section 56 4(b)(1) of the Act, 21 U.S.C. section 360bbb-3(b)(1), unless the authorization is terminated or revoked sooner. Performed at [facility], [place] [address] [place], [place] [address]      Total time spend on discharging this patient, including the last patient exam, discussing the hospital stay, instructions for ongoing care as it relates to all pertinent caregivers, as well as preparing the medical discharge records, prescriptions, and/or referrals as applicable, is 30 minutes.    Enzo Bi, MD  [facility] 07/10/2019, 1:39 PM  If 7PM-7AM, please contact night-coverage

## 2019-07-10 NOTE — TOC Progression Note (Signed)
Transition of Care [facility]) - Progression Note    Patient Details  Name: Carlton Angermeier MRN: [id number] Date of Birth: 1935-02-04  Transition of Care [facility]) CM/SW [place], RN Phone Number: 07/10/2019, 2:14 PM  Clinical Narrative:    Faxed outpatient PT and OT referral for eval and treat to [facility] [phone]   Expected Discharge Plan: [place] Barriers to Discharge: Continued Medical Work up  Expected Discharge Plan and Services Expected Discharge Plan: [place]   Discharge Planning Services: CM Consult   Living arrangements for the past 2 months: Single Family Home Expected Discharge Date: 07/10/19               DME Arranged: Berta Minor rolling, Walker platform DME Agency: [employer] Date DME Agency Contacted: 07/10/19 Time DME Agency Contacted: [phone] Representative spoke with at DME Agency: [place] ([place]) Interventions    Readmission Risk Interventions No flowsheet data found.

## 2019-07-10 NOTE — TOC Initial Note (Signed)
Transition of Care [facility]) - Initial/Assessment Note    Patient Details  Name: Laura Morrow MRN: [id number] Date of Birth: 1935/03/14  Transition of Care [facility]) CM/SW Contact:    Su Hilt, RN Phone Number: 07/10/2019, 12:14 PM  Clinical Narrative:                 Met with the patient and the daughter to discuss DC plan and needs, she lives in [facility] independent housing, She will need a Rolling walker with a left arm platform, also needs a 3 in 1, I notified Brad with [employer] and the patient wants it delivered to her home, she will use [facility] for PT and OT I called Seth Bake to set up and she needs an Outpatient PT and OT order faxed to [phone], she is doing very well and getting up without assistance, she has transportation at [facility] and with Family, She is up to date with PCP and can afford her medications No additional needs  Expected Discharge Plan: [place] Barriers to Discharge: Continued Medical Work up   Patient Goals and CMS Choice Patient states their goals for this hospitalization and ongoing recovery are:: go home      Expected Discharge Plan and Services Expected Discharge Plan: [place]   Discharge Planning Services: CM Consult   Living arrangements for the past 2 months: Single Family Home                 DME Arranged: 3-N-1, Walker rolling, Geophysicist/field seismologist DME Agency: [employer] Date DME Agency Contacted: 07/10/19 Time DME Agency Contacted: [phone] Representative spoke with at DME Agency: Leroy Sea            Prior Living Arrangements/Services Living arrangements for the past 2 months: [place] Lives with:: Self(lives in [place] independent living community) Patient language and need for interpreter reviewed:: Yes Do you feel safe going back to the place where you live?: Yes      Need for Family Participation in Patient Care: No (Comment) Care giver support system in place?: Yes  (comment) Current home services: DME(shower chair hand held shower) Criminal Activity/Legal Involvement Pertinent to Current Situation/Hospitalization: No - Comment as needed  Activities of Daily Living Home Assistive Devices/Equipment: None ADL Screening (condition at time of admission) Patient's cognitive ability adequate to safely complete daily activities?: Yes Is the patient deaf or have difficulty hearing?: Yes Does the patient have difficulty seeing, even when wearing glasses/contacts?: No Does the patient have difficulty concentrating, remembering, or making decisions?: No Patient able to express need for assistance with ADLs?: Yes Does the patient have difficulty dressing or bathing?: No Independently performs ADLs?: Yes (appropriate for developmental age) Does the patient have difficulty walking or climbing stairs?: No Weakness of Legs: None Weakness of Arms/Hands: None  Permission Sought/Granted   Permission granted to share information with : Yes, Verbal Permission Granted              Emotional Assessment Appearance:: Appears stated age Attitude/Demeanor/Rapport: Engaged Affect (typically observed): Appropriate Orientation: : Oriented to Situation, Oriented to  Time, Oriented to Place, Oriented to Self Alcohol / Substance Use: Not Applicable Psych Involvement: No (comment)  Admission diagnosis:  Intractable pain [R52] Fall, initial encounter [W19.XXXA] Closed fracture of left wrist, initial encounter [S62.102A] Closed fracture of sacrum, unspecified portion of sacrum, initial encounter [facility]) [S32.10XA] Patient Active Problem List   Diagnosis Date Noted  . Left wrist fracture 07/06/2019  .  Accidental fall 07/06/2019  . Fracture of sacrum, closed ([place]) 07/06/2019  . Intractable pain 07/06/2019  . Anxiety   . Cholecystitis 10/22/2018   PCP:  [place] [facility]:   [id number] [place]Phone: [phone]: [phone] [place] [address]Phone: [phone]: [phone]    Social Determinants of Health (SDOH) Interventions    Readmission Risk Interventions No flowsheet data found.

## 2019-07-27 ENCOUNTER — Ambulatory Visit: Payer: 59 | Admitting: Dermatology

## 2019-08-27 DIAGNOSIS — N3941 Urge incontinence: Secondary | ICD-10-CM | POA: Insufficient documentation

## 2019-11-08 ENCOUNTER — Encounter: Payer: Self-pay | Admitting: Ophthalmology

## 2019-11-16 ENCOUNTER — Other Ambulatory Visit
Admission: RE | Admit: 2019-11-16 | Discharge: 2019-11-16 | Disposition: A | Discharge status: Home / Self Care | Payer: Medicare Other | Source: Ambulatory Visit | Attending: Ophthalmology | Admitting: Ophthalmology

## 2019-11-16 ENCOUNTER — Other Ambulatory Visit: Payer: Self-pay

## 2019-11-16 DIAGNOSIS — Z01812 Encounter for preprocedural laboratory examination: Secondary | ICD-10-CM | POA: Diagnosis present

## 2019-11-16 DIAGNOSIS — Z20822 Contact with and (suspected) exposure to covid-19: Secondary | ICD-10-CM | POA: Insufficient documentation

## 2019-11-16 LAB — SARS CORONAVIRUS 2 (TAT 6-24 HRS): SARS Coronavirus 2: NEGATIVE

## 2019-11-19 NOTE — Discharge Instructions (Signed)

## 2019-11-20 ENCOUNTER — Ambulatory Visit: Payer: Medicare Other | Admitting: Anesthesiology

## 2019-11-20 ENCOUNTER — Other Ambulatory Visit: Payer: Self-pay

## 2019-11-20 ENCOUNTER — Encounter: Payer: Self-pay | Admitting: Ophthalmology

## 2019-11-20 ENCOUNTER — Encounter
Admission: RE | Disposition: A | Discharge status: Home / Self Care | Payer: Self-pay | Source: Home / Self Care | Attending: Ophthalmology

## 2019-11-20 ENCOUNTER — Ambulatory Visit
Admission: RE | Admit: 2019-11-20 | Discharge: 2019-11-20 | Disposition: A | Discharge status: Home / Self Care | Payer: Medicare Other | Attending: Ophthalmology | Admitting: Ophthalmology

## 2019-11-20 DIAGNOSIS — H2511 Age-related nuclear cataract, right eye: Secondary | ICD-10-CM | POA: Insufficient documentation

## 2019-11-20 DIAGNOSIS — Z79899 Other long term (current) drug therapy: Secondary | ICD-10-CM | POA: Diagnosis not present

## 2019-11-20 DIAGNOSIS — M1990 Unspecified osteoarthritis, unspecified site: Secondary | ICD-10-CM | POA: Diagnosis not present

## 2019-11-20 DIAGNOSIS — F419 Anxiety disorder, unspecified: Secondary | ICD-10-CM | POA: Diagnosis not present

## 2019-11-20 HISTORY — DX: Unspecified hearing loss, unspecified ear: H91.90

## 2019-11-20 HISTORY — DX: Herpesviral infection, unspecified: B00.9

## 2019-11-20 HISTORY — DX: Unspecified osteoarthritis, unspecified site: M19.90

## 2019-11-20 HISTORY — DX: Noninfective gastroenteritis and colitis, unspecified: K52.9

## 2019-11-20 HISTORY — PX: CATARACT EXTRACTION W/PHACO: SHX586

## 2019-11-20 SURGERY — PHACOEMULSIFICATION, CATARACT, WITH IOL INSERTION
Anesthesia: Topical | Site: Eye | Laterality: Right

## 2019-11-20 MED ORDER — TETRACAINE HCL 0.5 % OP SOLN
1.0000 [drp] | OPHTHALMIC | Status: DC | PRN
  Administered 2019-11-20 (×3): 1 [drp] via OPHTHALMIC

## 2019-11-20 MED ORDER — ACETAMINOPHEN 325 MG PO TABS: 325.0000 mg | ORAL_TABLET | Freq: Once | ORAL | Status: DC

## 2019-11-20 MED ORDER — NA CHONDROIT SULF-NA HYALURON 40-17 MG/ML IO SOLN
INTRAOCULAR | Status: DC | PRN
  Administered 2019-11-20: 1 mL via INTRAOCULAR

## 2019-11-20 MED ORDER — MIDAZOLAM HCL 2 MG/2ML IJ SOLN
INTRAMUSCULAR | Status: DC | PRN
  Administered 2019-11-20: 1 mg via INTRAVENOUS

## 2019-11-20 MED ORDER — ARMC OPHTHALMIC DILATING DROPS
1.0000 "application " | OPHTHALMIC | Status: DC | PRN
  Administered 2019-11-20 (×3): 1 via OPHTHALMIC

## 2019-11-20 MED ORDER — MOXIFLOXACIN HCL 0.5 % OP SOLN
OPHTHALMIC | Status: DC | PRN
  Administered 2019-11-20: 0.2 mL via OPHTHALMIC

## 2019-11-20 MED ORDER — BRIMONIDINE TARTRATE-TIMOLOL 0.2-0.5 % OP SOLN
OPHTHALMIC | Status: DC | PRN
  Administered 2019-11-20: 1 [drp] via OPHTHALMIC

## 2019-11-20 MED ORDER — EPINEPHRINE PF 1 MG/ML IJ SOLN
INTRAOCULAR | Status: DC | PRN
  Administered 2019-11-20: 56 mL via OPHTHALMIC

## 2019-11-20 MED ORDER — LACTATED RINGERS IV SOLN: INTRAVENOUS | Status: DC

## 2019-11-20 MED ORDER — LIDOCAINE HCL (PF) 2 % IJ SOLN
INTRAOCULAR | Status: DC | PRN
  Administered 2019-11-20: 1 mL

## 2019-11-20 MED ORDER — ACETAMINOPHEN 160 MG/5ML PO SOLN: 325.0000 mg | Freq: Once | ORAL | Status: DC

## 2019-11-20 MED ORDER — FENTANYL CITRATE (PF) 100 MCG/2ML IJ SOLN
INTRAMUSCULAR | Status: DC | PRN
Start: 2019-11-20 — End: 2019-11-20
  Administered 2019-11-20: 50 ug via INTRAVENOUS

## 2019-11-20 SURGICAL SUPPLY — 17 items
CANNULA ANT/CHMB 27GA (MISCELLANEOUS) ×6 IMPLANT
GLOVE SURG LX 8.0 MICRO (GLOVE) ×2
GLOVE SURG LX STRL 8.0 MICRO (GLOVE) ×1 IMPLANT
GLOVE SURG TRIUMPH 8.0 PF LTX (GLOVE) ×3 IMPLANT
GOWN STRL REUS W/ TWL LRG LVL3 (GOWN DISPOSABLE) ×2 IMPLANT
GOWN STRL REUS W/TWL LRG LVL3 (GOWN DISPOSABLE) ×6
LENS IOL TECNIS EYHANCE 25.0 ×3 IMPLANT
MARKER SKIN DUAL TIP RULER LAB (MISCELLANEOUS) ×3 IMPLANT
NEEDLE FILTER BLUNT 18X 1/2SAF (NEEDLE) ×2
NEEDLE FILTER BLUNT 18X1 1/2 (NEEDLE) ×1 IMPLANT
PACK EYE AFTER SURG (MISCELLANEOUS) ×3 IMPLANT
PACK OPTHALMIC (MISCELLANEOUS) ×3 IMPLANT
PACK PORFILIO (MISCELLANEOUS) ×3 IMPLANT
SYR 3ML LL SCALE MARK (SYRINGE) ×3 IMPLANT
SYR TB 1ML LUER SLIP (SYRINGE) ×3 IMPLANT
WATER STERILE IRR 250ML POUR (IV SOLUTION) ×3 IMPLANT
WIPE NON LINTING 3.25X3.25 (MISCELLANEOUS) ×3 IMPLANT

## 2019-11-20 NOTE — Anesthesia Preprocedure Evaluation (Signed)
Anesthesia Evaluation  Patient identified by MRN, date of birth, ID band Patient awake    Reviewed: Allergy & Precautions, H&P , NPO status , Patient's Chart, lab work & pertinent test results  Airway Mallampati: II  TM Distance: >3 FB Neck ROM: full    Dental no notable dental hx.    Pulmonary    Pulmonary exam normal breath sounds clear to auscultation       Cardiovascular Normal cardiovascular exam Rhythm:regular Rate:Normal     Neuro/Psych PSYCHIATRIC DISORDERS    GI/Hepatic   Endo/Other    Renal/GU      Musculoskeletal   Abdominal   Peds  Hematology   Anesthesia Other Findings   Reproductive/Obstetrics                             Anesthesia Physical Anesthesia Plan  ASA: II  Anesthesia Plan:    Post-op Pain Management:    Induction:   PONV Risk Score and Plan: 2 and Treatment may vary due to age or medical condition and TIVA  Airway Management Planned:   Additional Equipment:   Intra-op Plan:   Post-operative Plan:   Informed Consent: I have reviewed the patients History and Physical, chart, labs and discussed the procedure including the risks, benefits and alternatives for the proposed anesthesia with the patient or authorized representative who has indicated his/her understanding and acceptance.     Dental Advisory Given  Plan Discussed with: CRNA  Anesthesia Plan Comments:         Anesthesia Quick Evaluation

## 2019-11-20 NOTE — Op Note (Signed)
PREOPERATIVE DIAGNOSIS:  Nuclear sclerotic cataract of the right eye.   POSTOPERATIVE DIAGNOSIS:  H25.11 Cataract   OPERATIVE PROCEDURE:@   SURGEON:  Birder Robson, MD.   ANESTHESIA:  Anesthesiologist: Ronelle Nigh, MD CRNA: Jeannene Patella, CRNA  1.      Managed anesthesia care. 2.      0.14ml of Shugarcaine was instilled in the eye following the paracentesis.   COMPLICATIONS:  None.   TECHNIQUE:   Stop and chop   DESCRIPTION OF PROCEDURE:  The patient was examined and consented in the preoperative holding area where the aforementioned topical anesthesia was applied to the right eye and then brought back to the Operating Room where the right eye was prepped and draped in the usual sterile ophthalmic fashion and a lid speculum was placed. A paracentesis was created with the side port blade and the anterior chamber was filled with viscoelastic. A near clear corneal incision was performed with the steel keratome. A continuous curvilinear capsulorrhexis was performed with a cystotome followed by the capsulorrhexis forceps. Hydrodissection and hydrodelineation were carried out with BSS on a blunt cannula. The lens was removed in a stop and chop  technique and the remaining cortical material was removed with the irrigation-aspiration handpiece. The capsular bag was inflated with viscoelastic and the Technis ZCB00  lens was placed in the capsular bag without complication. The remaining viscoelastic was removed from the eye with the irrigation-aspiration handpiece. The wounds were hydrated. The anterior chamber was flushed with BSS and the eye was inflated to physiologic pressure. 0.4ml of Vigamox was placed in the anterior chamber. The wounds were found to be water tight. The eye was dressed with Combigan. The patient was given protective glasses to wear throughout the day and a shield with which to sleep tonight. The patient was also given drops with which to begin a drop regimen today and  will follow-up with me in one day. Implant Name Type Inv. Item Serial No. Manufacturer Lot No. LRB No. Used Action  LENS II EYHANCE 25.0 - [id number]  LENS II EYHANCE 25.0 [id number] JOHNSON   Right 1 Implanted   Procedure(s): CATARACT EXTRACTION PHACO AND INTRAOCULAR LENS PLACEMENT (IOC) RIGHT 10.10  01:02.2 (Right)  Electronically signed: Birder Robson 11/20/2019 11:55 AM

## 2019-11-20 NOTE — Anesthesia Procedure Notes (Signed)
Procedure Name: MAC Date/Time: 11/20/2019 11:44 AM Performed by: Jeannene Patella, CRNA Pre-anesthesia Checklist: Patient identified, Emergency Drugs available, Suction available, Timeout performed and Patient being monitored Patient Re-evaluated:Patient Re-evaluated prior to induction Oxygen Delivery Method: Nasal cannula Placement Confirmation: positive ETCO2

## 2019-11-20 NOTE — Transfer of Care (Signed)
Immediate Anesthesia Transfer of Care Note  Patient: Laura Morrow  Procedure(s) Performed: CATARACT EXTRACTION PHACO AND INTRAOCULAR LENS PLACEMENT (IOC) RIGHT 10.10  01:02.2 (Right Eye)  Patient Location: PACU  Anesthesia Type: No value filed.  Level of Consciousness: awake, alert  and patient cooperative  Airway and Oxygen Therapy: Patient Spontanous Breathing and Patient connected to supplemental oxygen  Post-op Assessment: Post-op Vital signs reviewed, Patient's Cardiovascular Status Stable, Respiratory Function Stable, Patent Airway and No signs of Nausea or vomiting  Post-op Vital Signs: Reviewed and stable  Complications: No complications documented.

## 2019-11-20 NOTE — Anesthesia Postprocedure Evaluation (Signed)
Anesthesia Post Note  Patient: Laura Morrow  Procedure(s) Performed: CATARACT EXTRACTION PHACO AND INTRAOCULAR LENS PLACEMENT (IOC) RIGHT 10.10  01:02.2 (Right Eye)     Patient location during evaluation: PACU Level of consciousness: awake and alert and oriented Pain management: satisfactory to patient Vital Signs Assessment: post-procedure vital signs reviewed and stable Respiratory status: spontaneous breathing, nonlabored ventilation and respiratory function stable Cardiovascular status: blood pressure returned to baseline and stable Postop Assessment: Adequate PO intake and No signs of nausea or vomiting Anesthetic complications: no   No complications documented.  Raliegh Ip

## 2019-11-20 NOTE — H&P (Signed)
All labs reviewed. Abnormal studies sent to patients PCP when indicated.  Previous H&P reviewed, patient examined, there are NO CHANGES.  Laura Maturin Porfilio8/31/202111:25 AM

## 2019-12-03 ENCOUNTER — Encounter: Payer: Self-pay | Admitting: Ophthalmology

## 2019-12-07 ENCOUNTER — Other Ambulatory Visit: Payer: Self-pay

## 2019-12-07 ENCOUNTER — Other Ambulatory Visit
Admission: RE | Admit: 2019-12-07 | Discharge: 2019-12-07 | Disposition: A | Discharge status: Home / Self Care | Payer: Medicare Other | Source: Ambulatory Visit | Attending: Ophthalmology | Admitting: Ophthalmology

## 2019-12-07 DIAGNOSIS — Z20822 Contact with and (suspected) exposure to covid-19: Secondary | ICD-10-CM | POA: Insufficient documentation

## 2019-12-07 DIAGNOSIS — Z01812 Encounter for preprocedural laboratory examination: Secondary | ICD-10-CM | POA: Insufficient documentation

## 2019-12-08 LAB — SARS CORONAVIRUS 2 (TAT 6-24 HRS): SARS Coronavirus 2: NEGATIVE

## 2019-12-10 NOTE — Discharge Instructions (Signed)

## 2019-12-11 ENCOUNTER — Ambulatory Visit: Payer: Medicare Other | Admitting: Anesthesiology

## 2019-12-11 ENCOUNTER — Encounter: Payer: Self-pay | Admitting: Ophthalmology

## 2019-12-11 ENCOUNTER — Other Ambulatory Visit: Payer: Self-pay

## 2019-12-11 ENCOUNTER — Encounter
Admission: RE | Disposition: A | Discharge status: Home / Self Care | Payer: Self-pay | Source: Home / Self Care | Attending: Ophthalmology

## 2019-12-11 ENCOUNTER — Ambulatory Visit
Admission: RE | Admit: 2019-12-11 | Discharge: 2019-12-11 | Disposition: A | Discharge status: Home / Self Care | Payer: Medicare Other | Attending: Ophthalmology | Admitting: Ophthalmology

## 2019-12-11 DIAGNOSIS — F329 Major depressive disorder, single episode, unspecified: Secondary | ICD-10-CM | POA: Insufficient documentation

## 2019-12-11 DIAGNOSIS — B009 Herpesviral infection, unspecified: Secondary | ICD-10-CM | POA: Diagnosis not present

## 2019-12-11 DIAGNOSIS — Z882 Allergy status to sulfonamides status: Secondary | ICD-10-CM | POA: Insufficient documentation

## 2019-12-11 DIAGNOSIS — M1990 Unspecified osteoarthritis, unspecified site: Secondary | ICD-10-CM | POA: Insufficient documentation

## 2019-12-11 DIAGNOSIS — H2512 Age-related nuclear cataract, left eye: Secondary | ICD-10-CM | POA: Diagnosis present

## 2019-12-11 DIAGNOSIS — Z79899 Other long term (current) drug therapy: Secondary | ICD-10-CM | POA: Insufficient documentation

## 2019-12-11 DIAGNOSIS — Z791 Long term (current) use of non-steroidal anti-inflammatories (NSAID): Secondary | ICD-10-CM | POA: Diagnosis not present

## 2019-12-11 HISTORY — PX: CATARACT EXTRACTION W/PHACO: SHX586

## 2019-12-11 SURGERY — PHACOEMULSIFICATION, CATARACT, WITH IOL INSERTION
Anesthesia: Monitor Anesthesia Care | Site: Eye | Laterality: Left

## 2019-12-11 MED ORDER — EPINEPHRINE PF 1 MG/ML IJ SOLN
INTRAOCULAR | Status: DC | PRN
  Administered 2019-12-11: 56 mL via OPHTHALMIC

## 2019-12-11 MED ORDER — LIDOCAINE HCL (PF) 2 % IJ SOLN
INTRAOCULAR | Status: DC | PRN
  Administered 2019-12-11: 2 mL

## 2019-12-11 MED ORDER — BRIMONIDINE TARTRATE-TIMOLOL 0.2-0.5 % OP SOLN
OPHTHALMIC | Status: DC | PRN
  Administered 2019-12-11: 1 [drp] via OPHTHALMIC

## 2019-12-11 MED ORDER — NA CHONDROIT SULF-NA HYALURON 40-17 MG/ML IO SOLN
INTRAOCULAR | Status: DC | PRN
  Administered 2019-12-11: 1 mL via INTRAOCULAR

## 2019-12-11 MED ORDER — MOXIFLOXACIN HCL 0.5 % OP SOLN
OPHTHALMIC | Status: DC | PRN
  Administered 2019-12-11: 0.2 mL via OPHTHALMIC

## 2019-12-11 MED ORDER — FENTANYL CITRATE (PF) 100 MCG/2ML IJ SOLN
INTRAMUSCULAR | Status: DC | PRN
Start: 2019-12-11 — End: 2019-12-11
  Administered 2019-12-11: 50 ug via INTRAVENOUS

## 2019-12-11 MED ORDER — TETRACAINE HCL 0.5 % OP SOLN
1.0000 [drp] | OPHTHALMIC | Status: DC | PRN
  Administered 2019-12-11 (×3): 1 [drp] via OPHTHALMIC

## 2019-12-11 MED ORDER — ARMC OPHTHALMIC DILATING DROPS
1.0000 "application " | OPHTHALMIC | Status: DC | PRN
  Administered 2019-12-11 (×3): 1 via OPHTHALMIC

## 2019-12-11 MED ORDER — MIDAZOLAM HCL 2 MG/2ML IJ SOLN
INTRAMUSCULAR | Status: DC | PRN
  Administered 2019-12-11: 1 mg via INTRAVENOUS

## 2019-12-11 SURGICAL SUPPLY — 23 items
CANNULA ANT/CHMB 27G (MISCELLANEOUS) ×2 IMPLANT
CANNULA ANT/CHMB 27GA (MISCELLANEOUS) ×6 IMPLANT
GLOVE SURG LX 8.0 MICRO (GLOVE) ×2
GLOVE SURG LX STRL 8.0 MICRO (GLOVE) ×1 IMPLANT
GLOVE SURG TRIUMPH 8.0 PF LTX (GLOVE) ×3 IMPLANT
GOWN STRL REUS W/ TWL LRG LVL3 (GOWN DISPOSABLE) ×2 IMPLANT
GOWN STRL REUS W/TWL LRG LVL3 (GOWN DISPOSABLE) ×6
LENS IOL ACRSF MP 24.0 (Intraocular Lens) IMPLANT
LENS IOL ACRYSOF POST 24.0 (Intraocular Lens) ×3 IMPLANT
LENS IOL TECNIS EYHANCE 24.5 IMPLANT
MARKER SKIN DUAL TIP RULER LAB (MISCELLANEOUS) ×3 IMPLANT
NDL FILTER BLUNT 18X1 1/2 (NEEDLE) ×1 IMPLANT
NEEDLE FILTER BLUNT 18X 1/2SAF (NEEDLE) ×2
NEEDLE FILTER BLUNT 18X1 1/2 (NEEDLE) ×1 IMPLANT
PACK EYE AFTER SURG (MISCELLANEOUS) ×3 IMPLANT
PACK OPTHALMIC (MISCELLANEOUS) ×3 IMPLANT
PACK PORFILIO (MISCELLANEOUS) ×3 IMPLANT
SUT ETHILON 10-0 CS-B-6CS-B-6 (SUTURE) ×3
SUTURE EHLN 10-0 CS-B-6CS-B-6 (SUTURE) IMPLANT
SYR 3ML LL SCALE MARK (SYRINGE) ×3 IMPLANT
SYR TB 1ML LUER SLIP (SYRINGE) ×3 IMPLANT
WATER STERILE IRR 250ML POUR (IV SOLUTION) ×3 IMPLANT
WIPE NON LINTING 3.25X3.25 (MISCELLANEOUS) ×3 IMPLANT

## 2019-12-11 NOTE — Anesthesia Postprocedure Evaluation (Signed)
Anesthesia Post Note  Patient: Laura Morrow  Procedure(s) Performed: CATARACT EXTRACTION PHACO AND INTRAOCULAR LENS PLACEMENT (IOC) LEFT 8.88 00:53.4 (Left Eye)     Patient location during evaluation: PACU Anesthesia Type: MAC Level of consciousness: awake and alert Pain management: pain level controlled Vital Signs Assessment: post-procedure vital signs reviewed and stable Respiratory status: spontaneous breathing Cardiovascular status: blood pressure returned to baseline Postop Assessment: no apparent nausea or vomiting, adequate PO intake and no headache Anesthetic complications: no   No complications documented.  Adele Barthel Kortney Potvin

## 2019-12-11 NOTE — Anesthesia Preprocedure Evaluation (Signed)
Anesthesia Evaluation  Patient identified by MRN, date of birth, ID band Patient awake    History of Anesthesia Complications Negative for: history of anesthetic complications  Airway Mallampati: II  TM Distance: >3 FB Neck ROM: Full    Dental no notable dental hx.    Pulmonary neg pulmonary ROS,    Pulmonary exam normal        Cardiovascular Exercise Tolerance: Good negative cardio ROS Normal cardiovascular exam     Neuro/Psych negative neurological ROS     GI/Hepatic negative GI ROS, Neg liver ROS,   Endo/Other  negative endocrine ROS  Renal/GU negative Renal ROS     Musculoskeletal   Abdominal   Peds  Hematology negative hematology ROS (+)   Anesthesia Other Findings   Reproductive/Obstetrics                             Anesthesia Physical Anesthesia Plan  ASA: I  Anesthesia Plan: MAC   Post-op Pain Management:    Induction: Intravenous  PONV Risk Score and Plan: 2 and Midazolam, TIVA and Treatment may vary due to age or medical condition  Airway Management Planned: Nasal Cannula and Natural Airway  Additional Equipment: None  Intra-op Plan:   Post-operative Plan:   Informed Consent: I have reviewed the patients History and Physical, chart, labs and discussed the procedure including the risks, benefits and alternatives for the proposed anesthesia with the patient or authorized representative who has indicated his/her understanding and acceptance.     Dental advisory given  Plan Discussed with: CRNA  Anesthesia Plan Comments:         Anesthesia Quick Evaluation

## 2019-12-11 NOTE — Transfer of Care (Signed)
Immediate Anesthesia Transfer of Care Note  Patient: Laura Morrow  Procedure(s) Performed: CATARACT EXTRACTION PHACO AND INTRAOCULAR LENS PLACEMENT (IOC) LEFT 8.88 00:53.4 (Left Eye)  Patient Location: PACU  Anesthesia Type: MAC  Level of Consciousness: awake, alert  and patient cooperative  Airway and Oxygen Therapy: Patient Spontanous Breathing and Patient connected to supplemental oxygen  Post-op Assessment: Post-op Vital signs reviewed, Patient's Cardiovascular Status Stable, Respiratory Function Stable, Patent Airway and No signs of Nausea or vomiting  Post-op Vital Signs: Reviewed and stable  Complications: No complications documented.

## 2019-12-11 NOTE — Op Note (Signed)
PREOPERATIVE DIAGNOSIS:  Nuclear sclerotic cataract of the left eye.   POSTOPERATIVE DIAGNOSIS:  Nuclear sclerotic cataract of the left eye.   OPERATIVE PROCEDURE:@   SURGEON:  Birder Robson, MD.   ANESTHESIA:  Anesthesiologist: Page, Adele Barthel, MD CRNA: Cameron Ali, CRNA  1.      Managed anesthesia care. 2.     0.69ml of Shugarcaine was instilled following the paracentesis   COMPLICATIONS:  during cortical cleanup the posterior capsule engaged the I/A tip creating a small hole. This expanded when discovisc was introduced.  The anterior capsule was well intact and so a sulcus lens was easily placed. No vitreous was seen in the anterior chamber. A small section of cortex remained in the capsule.  10-0 nylon suture was placed and the wound was water tight.   TECHNIQUE:   Stop and chop   DESCRIPTION OF PROCEDURE:  The patient was examined and consented in the preoperative holding area where the aforementioned topical anesthesia was applied to the left eye and then brought back to the Operating Room where the left eye was prepped and draped in the usual sterile ophthalmic fashion and a lid speculum was placed. A paracentesis was created with the side port blade and the anterior chamber was filled with viscoelastic. A near clear corneal incision was performed with the steel keratome. A continuous curvilinear capsulorrhexis was performed with a cystotome followed by the capsulorrhexis forceps. Hydrodissection and hydrodelineation were carried out with BSS on a blunt cannula. The lens was removed in a stop and chop  technique and the remaining cortical material was removed with the irrigation-aspiration handpiece. The capsular bag was inflated with viscoelastic and the Technis ZCB00 lens was placed in the capsular bag without complication. The remaining viscoelastic was removed from the eye with the irrigation-aspiration handpiece. The wounds were hydrated. The anterior chamber was flushed with BSS  and the eye was inflated to physiologic pressure. 0.51ml Vigamox was placed in the anterior chamber. The wounds were found to be water tight. The eye was dressed with Combigan. The patient was given protective glasses to wear throughout the day and a shield with which to sleep tonight. The patient was also given drops with which to begin a drop regimen today and will follow-up with me in one day. Implant Name Type Inv. Item Serial No. Manufacturer Lot No. LRB No. Used Action  LENS II EYHANCE 24.5 - [id number]  LENS II EYHANCE 24.5 [id number] JOHNSON   Left 1 Wasted  LENS IOL POST 24.0 - [id number] Intraocular Lens LENS IOL POST 24.0 [id number] ALCON  Left 1 Implanted    Procedure(s): CATARACT EXTRACTION PHACO AND INTRAOCULAR LENS PLACEMENT (IOC) LEFT 8.88 00:53.4 (Left)  Electronically signed: Birder Robson 12/11/2019 1:14 PM

## 2019-12-11 NOTE — Anesthesia Procedure Notes (Signed)
Procedure Name: MAC Date/Time: 12/11/2019 12:50 PM Performed by: Cameron Ali, CRNA Pre-anesthesia Checklist: Patient identified, Emergency Drugs available, Suction available, Timeout performed and Patient being monitored Patient Re-evaluated:Patient Re-evaluated prior to induction Oxygen Delivery Method: Nasal cannula Placement Confirmation: positive ETCO2

## 2019-12-11 NOTE — H&P (Signed)
All labs reviewed. Abnormal studies sent to patients PCP when indicated.  Previous H&P reviewed, patient examined, there are NO CHANGES.  Laura Morrow Porfilio9/21/202112:30 PM

## 2019-12-12 ENCOUNTER — Encounter: Payer: Self-pay | Admitting: Ophthalmology

## 2020-07-21 ENCOUNTER — Other Ambulatory Visit: Payer: Self-pay

## 2020-07-21 ENCOUNTER — Encounter: Payer: Self-pay | Admitting: Dermatology

## 2020-07-21 ENCOUNTER — Ambulatory Visit (INDEPENDENT_AMBULATORY_CARE_PROVIDER_SITE_OTHER): Payer: Medicare Other | Admitting: Dermatology

## 2020-07-21 DIAGNOSIS — Z85828 Personal history of other malignant neoplasm of skin: Secondary | ICD-10-CM

## 2020-07-21 DIAGNOSIS — L570 Actinic keratosis: Secondary | ICD-10-CM

## 2020-07-21 DIAGNOSIS — L820 Inflamed seborrheic keratosis: Secondary | ICD-10-CM

## 2020-07-21 DIAGNOSIS — L304 Erythema intertrigo: Secondary | ICD-10-CM | POA: Diagnosis not present

## 2020-07-21 MED ORDER — HYDROCORTISONE 2.5 % EX CREA: TOPICAL_CREAM | CUTANEOUS | 1 refills | Status: DC

## 2020-07-21 MED ORDER — KETOCONAZOLE 2 % EX CREA: 1.0000 "application " | TOPICAL_CREAM | CUTANEOUS | 1 refills | Status: DC

## 2020-07-21 NOTE — Patient Instructions (Addendum)
If you have any questions or concerns for your doctor, please call our main line at [phone] and press option 4 to reach your doctor's medical assistant. If no one answers, please leave a voicemail as directed and we will return your call as soon as possible. Messages left after 4 pm will be answered the following business day.   You may also send [place] a message via [place]. We typically respond to MyChart messages within 1-2 business days.  For prescription refills, please ask your pharmacy to contact our office. Our fax number is [phone].  If you have an urgent issue when the clinic is closed that cannot wait until the next business day, you can page your doctor at the number below.    Please note that while we do our best to be available for urgent issues outside of office hours, we are not available 24/7.   If you have an urgent issue and are unable to reach [place], you may choose to seek medical care at your doctor's office, retail clinic, urgent care center, or emergency room.  If you have a medical emergency, please immediately call 911 or go to the emergency department.  Pager Numbers  - Dr. Nehemiah Massed: [phone]  - Dr. Laurence Ferrari: [phone]  - Dr. Nicole Kindred: [phone]  In the event of inclement weather, please call our main line at [phone] for an update on the status of any delays or closures.  Dermatology Medication Tips: Please keep the boxes that topical medications come in in order to help keep track of the instructions about where and how to use these. Pharmacies typically print the medication instructions only on the boxes and not directly on the medication tubes.   If your medication is too expensive, please contact our office at [phone] option 4 or send [place] a message through [place].   We are unable to tell what your co-pay for medications will be in advance as this is different depending on your insurance coverage. However, we may be able to find a substitute  medication at lower cost or fill out paperwork to get insurance to cover a needed medication.   If a prior authorization is required to get your medication covered by your insurance company, please allow [place] 1-2 business days to complete this process.  Drug prices often vary depending on where the prescription is filled and some pharmacies may offer cheaper prices.  The website www.goodrx.com contains coupons for medications through different pharmacies. The prices here do not account for what the cost may be with help from insurance (it may be cheaper with your insurance), but the website can give you the price if you did not use any insurance.  - You can print the associated coupon and take it with your prescription to the pharmacy.  - You may also stop by our office during regular business hours and pick up a GoodRx coupon card.  - If you need your prescription sent electronically to a different pharmacy, notify our office through [facility] or by phone at [phone] option 4.    Intertrigo is a chronic recurrent rash that occurs in skin fold areas that may be associated with friction; heat; moisture; yeast; fungus; and bacteria.  It is exacerbated by increased movement / activity; sweating; and higher atmospheric temperature.  Intertrigo posterior neck  Mix hydrocortisone with ketaconazole 2% twice a day. If improved, decrease to hydrocortisone and ketaconazole mixed once a day. If still clear, decrease to ketaconazole only.

## 2020-07-21 NOTE — Progress Notes (Signed)
New Patient Visit  Subjective  Laura Morrow is a 86 y.o. female who presents for the following: red spot (Post neck, has had >5 yrs, red and itchy, uses otc HC cream) and check crusty spot (R forehead, >25yrs, files with [facility]ory board).  Bothersome.  She has h/o BCC face, SCC on leg   The following portions of the chart were reviewed this encounter and updated as appropriate:       Review of Systems:  No other skin or systemic complaints except as noted in HPI or Assessment and Plan.  Objective  Well appearing patient in no apparent distress; mood and affect are within normal limits.  A focused examination was performed including post neck, face, R arm, L leg. Relevant physical exam findings are noted in the Assessment and Plan.  Objective  Right Forearm x 1, R lat forehead x 1 (2): Erythematous keratotic or waxy stuck-on papule  Objective  Neck - Posterior: Erythema with some moisture posterior neck crease  Objective  Right med inf canthus: Scar, clear  Objective  Left distal shin: Scar, clear  Objective  Right Elbow x 1: pink scaly macule at superior edge of white scar   Assessment & Plan  Inflamed seborrheic keratosis (2) Right Forearm x 1, R lat forehead x 1  Prior to procedure, discussed risks of blister formation, small wound, skin dyspigmentation, or rare scar following cryotherapy.    Destruction of lesion - Right Forearm x 1, R lat forehead x 1  Destruction method: cryotherapy   Informed consent: discussed and consent obtained   Lesion destroyed using liquid nitrogen: Yes   Region frozen until ice ball extended beyond lesion: Yes   Outcome: patient tolerated procedure well with no complications   Post-procedure details: wound care instructions given    Intertrigo Neck - Posterior  Intertrigo is a chronic recurrent rash that occurs in skin fold areas that may be associated with friction; heat; moisture; yeast; fungus; and bacteria.  It is exacerbated  by increased movement / activity; sweating; and higher atmospheric temperature.   Start Ketoconazole 2% cr qd/bid until clear, then prn flares Start HC 2.5% cream qd to bid as directed until clear  Mix hydrocortisone with ketaconazole 2% twice a day. If improved, decrease to hydrocortisone and ketaconazole mixed once a day. If still clear, decrease to ketaconazole only.  ketoconazole (NIZORAL) 2 % cream - Neck - Posterior  hydrocortisone 2.5 % cream - Neck - Posterior  History of basal cell carcinoma (BCC) Right med inf canthus  Clear. Observe for recurrence. Call clinic for new or changing lesions.  Recommend regular skin exams, daily broad-spectrum spf 30+ sunscreen use, and photoprotection.     History of SCC (squamous cell carcinoma) of skin Left distal shin  Clear. Observe for recurrence. Call clinic for new or changing lesions.  Recommend regular skin exams, daily broad-spectrum spf 30+ sunscreen use, and photoprotection.     AK (actinic keratosis) Right Elbow x 1  Bx proven 02/03/2017 Recurrent  Prior to procedure, discussed risks of blister formation, small wound, skin dyspigmentation, or rare scar following cryotherapy.    Destruction of lesion - Right Elbow x 1  Destruction method: cryotherapy   Informed consent: discussed and consent obtained   Lesion destroyed using liquid nitrogen: Yes   Region frozen until ice ball extended beyond lesion: Yes   Outcome: patient tolerated procedure well with no complications   Post-procedure details: wound care instructions given      Return in about  1 year (around 07/21/2021) for TBSE, Hx of BCC, Hx of SCC, Hx of AKs.  I, Othelia Pulling, RMA, am acting as scribe for Brendolyn Patty, MD . Documentation: I have reviewed the above documentation for accuracy and completeness, and I agree with the above.  Brendolyn Patty MD

## 2020-07-24 ENCOUNTER — Telehealth: Payer: Self-pay

## 2020-07-24 NOTE — Telephone Encounter (Signed)
Patient called regarding her appt on Monday with you. She forgot to mention that she has trouble with a itchy scalp. She has used dandruff shampoos with no relief and it tends to make the condition worse.

## 2020-07-28 MED ORDER — KETOCONAZOLE 2 % EX SHAM: 1.0000 "application " | MEDICATED_SHAMPOO | CUTANEOUS | 3 refills | Status: DC

## 2020-07-28 NOTE — Telephone Encounter (Signed)
Patient advised of information per Dr. Stewart and RX sent in.  ?

## 2020-07-28 NOTE — Telephone Encounter (Signed)
We can send in ketoconazole 2% shampoo, use 2-3x/wk.  Let sit several minutes prior to rinsing.  Trade, 3 rfs.  If that isn't helping, she can schedule an office visit for further evaluation.

## 2021-02-17 DIAGNOSIS — M810 Age-related osteoporosis without current pathological fracture: Secondary | ICD-10-CM | POA: Insufficient documentation

## 2021-07-21 ENCOUNTER — Other Ambulatory Visit: Payer: Self-pay

## 2021-07-21 ENCOUNTER — Encounter: Payer: Self-pay | Admitting: Emergency Medicine

## 2021-07-21 ENCOUNTER — Emergency Department: Payer: Medicare Other

## 2021-07-21 ENCOUNTER — Emergency Department
Admission: EM | Admit: 2021-07-21 | Discharge: 2021-07-21 | Disposition: A | Discharge status: Home / Self Care | Payer: Medicare Other | Attending: Emergency Medicine | Admitting: Emergency Medicine

## 2021-07-21 DIAGNOSIS — S5012XA Contusion of left forearm, initial encounter: Secondary | ICD-10-CM | POA: Insufficient documentation

## 2021-07-21 DIAGNOSIS — M545 Low back pain, unspecified: Secondary | ICD-10-CM | POA: Diagnosis not present

## 2021-07-21 DIAGNOSIS — S59912A Unspecified injury of left forearm, initial encounter: Secondary | ICD-10-CM | POA: Diagnosis present

## 2021-07-21 DIAGNOSIS — W1830XA Fall on same level, unspecified, initial encounter: Secondary | ICD-10-CM | POA: Insufficient documentation

## 2021-07-21 MED ORDER — KETOROLAC TROMETHAMINE 15 MG/ML IJ SOLN
10.0000 mg | Freq: Once | INTRAMUSCULAR | Status: DC
  Filled 2021-07-21: qty 1

## 2021-07-21 MED ORDER — KETOROLAC TROMETHAMINE 15 MG/ML IJ SOLN
10.0000 mg | Freq: Once | INTRAMUSCULAR | Status: AC
  Administered 2021-07-21: 10 mg via INTRAMUSCULAR

## 2021-07-21 MED ORDER — ACETAMINOPHEN 500 MG PO TABS
1000.0000 mg | ORAL_TABLET | Freq: Once | ORAL | Status: AC
Start: 2021-07-21 — End: 2021-07-21
  Administered 2021-07-21: 1000 mg via ORAL
  Filled 2021-07-21: qty 2

## 2021-07-21 NOTE — ED Triage Notes (Signed)
Presents via EMS from [facility]  states she stumbled and fell  large hematoma to left elbow and back pain ?

## 2021-07-21 NOTE — ED Notes (Signed)
Patient advocate from [facility] states the she needs a FL2 form before she goes back .States this needed to be done as soon as possible  [place] manager  in the ED will call and this taken care of  Pt 's advocate made aware  ?

## 2021-07-21 NOTE — TOC Transition Note (Signed)
Transition of Care (TOC) - CM/SW Discharge Note ? ? ?Patient Details  ?Name: Laura Morrow ?MRN: [id number] ?Date of Birth: 1934-06-22 ? ?Transition of Care (TOC) CM/SW Contact:  ?Shelbie Hutching, RN ?Phone Number: ?07/21/2021, 2:33 PM ? ? ?Clinical Narrative:    ?Patient seen in the emergency room after falling at home.  Patient is from [place], the plan is for her to return to [facility] and do respite care for a short time.  RNCM contacted Seth Bake at [facility] and sent her [facility] also provided patient with hard copy of FL2.   ?Patient discharged, she is accompanied by someone from [facility].  ? ? ?Final next level of care: Assisted Living (Respite) ?Barriers to Discharge: Barriers Resolved ? ? ?Patient Goals and CMS Choice ?Patient states their goals for this hospitalization and ongoing recovery are:: to get an FL2 and go to [facility] for respite ?CMS Medicare.gov Compare Post Acute Care list provided to:: Patient ?Choice offered to / list presented to : Patient ? ?Discharge Placement ?  ?           ?  ?  ?  ?  ? ?Discharge Plan and Services ?  ?Discharge Planning Services: CM Consult ?           ?DME Arranged: N/A ?DME Agency: NA ?  ?  ?  ?HH Arranged: NA ?[place] Agency: NA ?  ?  ?  ? ?Social Determinants of Health (SDOH) Interventions ?  ? ? ?Readmission Risk Interventions ?   ? View : No data to display.  ?  ?  ?  ? ? ? ? ? ?

## 2021-07-21 NOTE — Discharge Instructions (Signed)
Your xrays today do not show any serious injuries. Please follow up with your doctor for continued monitoring of your symptoms.  In the meantime, take tylenol '650mg'$  every 6 hours for pain.  Apply ice to the painful areas today and tomorrow to decrease swelling and pain. ?

## 2021-07-21 NOTE — ED Notes (Signed)
Patient transported to X-ray 

## 2021-07-21 NOTE — NC FL2 (Signed)
?  [place] MEDICAID FL2 LEVEL OF CARE SCREENING TOOL  ?  ? ?IDENTIFICATION  ?Patient Name: ?Laura Morrow Birthdate: Mar 15, 1935 Sex: female Admission Date (Current Location): ?07/21/2021  ?[place] and [place] Number: ? [place] ?  Facility and Address:  ?[facility], [address] [place], [place] [address] ?     Provider Number: ?[id number]  ?Attending Physician Name and Address:  ?Carrie Mew, MD ? Relative Name and Phone Number:  ?Christel Mormon- daughter [phone] ?   ?Current Level of Care: ?Other (Comment) Recommended Level of Care: ?Other (Comment) (MC/AL) Prior Approval Number: ?  ? ?Date Approved/Denied: ?  PASRR Number: ?  ? ?Discharge Plan: ?Other (Comment) (MC/AL) ?  ? ?Current Diagnoses: ?Patient Active Problem List  ? Diagnosis Date Noted  ? Left wrist fracture 07/06/2019  ? Accidental fall 07/06/2019  ? Fracture of sacrum, closed ([place]) 07/06/2019  ? Intractable pain 07/06/2019  ? Anxiety   ? Cholecystitis 10/22/2018  ? ? ?Orientation RESPIRATION BLADDER Height & Weight   ?  ?Self, Time, Situation, Place ? Normal Continent Weight: 63.5 kg ?Height:  '5\' 3"'$  (160 cm)  ?BEHAVIORAL SYMPTOMS/MOOD NEUROLOGICAL BOWEL NUTRITION STATUS  ?    Continent Diet (Regular)  ?AMBULATORY STATUS COMMUNICATION OF NEEDS Skin   ?Supervision Verbally Normal ?  ?  ?  ?    ?     ?     ? ? ?Personal Care Assistance Level of Assistance  ?Bathing, Feeding, Dressing Bathing Assistance: Limited assistance ?Feeding assistance: Independent ?Dressing Assistance: Limited assistance ?   ? ?Functional Limitations Info  ?    ?  ?   ? ? ?SPECIAL CARE FACTORS FREQUENCY  ?    ?  ?  ?  ?  ?  ?  ?   ? ? ?Contractures Contractures Info: Not present  ? ? ?Additional Factors Info  ?Code Status, Allergies Code Status Info: Full Code ?Allergies Info: Sulfa antibiotics ?  ?  ?  ?   ? ?Current Medications (07/21/2021):  This is the current hospital active medication list ?No current facility-administered  medications for this encounter.  ? ?Current Outpatient Medications  ?Medication Sig Dispense Refill  ? Cholecalciferol (VITAMIN D3) 125 MCG (5000 UT) CAPS Take 5,000 Units by mouth daily.     ? cholecalciferol (VITAMIN D3) 25 MCG (1000 UNIT) tablet Take 2,000 Units by mouth daily.    ? citalopram (CELEXA) 40 MG tablet Take 1 tablet by mouth daily.    ? Cranberry Extract 250 MG TABS Take 500 mg by mouth daily.    ? hydrocortisone 2.5 % cream Apply topically as directed. Qd to bid aa rash on posterior neck prn flares 30 g 1  ? ibuprofen (ADVIL) 200 MG tablet Take 200 mg by mouth in the morning.    ? ketoconazole (NIZORAL) 2 % shampoo Apply 1 application topically as directed. Apply two to three times weekly. Let sit several minutes before rinsing. 120 mL 3  ? mesalamine (LIALDA) 1.2 g EC tablet Take 1.2 g by mouth daily.     ? naproxen sodium (ALEVE) 220 MG tablet Take 220 mg by mouth 2 (two) times daily as needed.     ? valACYclovir (VALTREX) 500 MG tablet Take 500 mg by mouth daily.    ? ? ? ?Discharge Medications: ?See AVS ? ?Relevant Imaging Results: ? ?Relevant Lab Results: ? ? ?Additional Information ?SS# [id number] ? ?Shelbie Hutching, RN ? ? ? ? ?

## 2021-07-21 NOTE — ED Provider Notes (Signed)
? ?[facility] ?Provider Note ? ? ? Event Date/Time  ? First MD Initiated Contact with Patient 07/21/21 1042   ?  (approximate) ? ? ?History  ? ?Fall ? ? ?HPI ? ?Laura Morrow is a 87 y.o. female with a history of anxiety, S1 kyphoplasty who is brought to the ED today due to a fall at her [facility] assisted living.  States she was in her usual state of health, asymptomatic when she accidentally stepped sideways with her foot causing her to lose balance and fall.  Denies head injury or loss of consciousness.  No preceding symptoms.  Currently complains of pain at the left elbow as well as in the lower back.  No headache or neck pain, no vision changes paresthesias or motor weakness.  No recent illness. ?  ? ? ?Physical Exam  ? ?Triage Vital Signs: ?ED Triage Vitals [07/21/21 1041]  ?Enc Vitals Group  ?   BP (!) 168/87  ?   Pulse Rate 83  ?   Resp 20  ?   Temp 97.6 ?F (36.4 ?C)  ?   Temp Source Oral  ?   SpO2 97 %  ?   Weight 140 lb (63.5 kg)  ?   Height '5\' 3"'$  (1.6 m)  ?   Head Circumference   ?   Peak Flow   ?   Pain Score   ?   Pain Loc   ?   Pain Edu?   ?   Excl. in [place]?   ? ? ?Most recent vital signs: ?Vitals:  ? 07/21/21 1041  ?BP: (!) 168/87  ?Pulse: 83  ?Resp: 20  ?Temp: 97.6 ?F (36.4 ?C)  ?SpO2: 97%  ? ? ? ?General: Awake, no distress.  ?CV:  Good peripheral perfusion.  Regular rate rhythm ?Resp:  Normal effort.  Clear to auscultation bilaterally. ?Abd:  No distention.  Soft and nontender ?Other:  No midline spinal tenderness.  There is some mild tenderness in the right paraspinous musculature in the lumbar area.  There is tenderness and hematoma over the left lateral elbow without crepitus or palpable deformity.  No open wounds. ?Moist oral mucosa.  No head trauma.  No C-spine tenderness.  Full range of motion in the neck. ? ? ?ED Results / Procedures / Treatments  ? ?Labs ?(all labs ordered are listed, but only abnormal results are displayed) ?Labs Reviewed - No data to  display ? ? ?EKG ? ? ? ? ?RADIOLOGY ?X-ray left elbow and lumbar spine viewed interpreted by me, unremarkable.  Radiology report reviewed. ? ? ? ?PROCEDURES: ? ?Critical Care performed: No ? ?Procedures ? ? ?MEDICATIONS ORDERED IN ED: ?Medications  ?ketorolac (TORADOL) 15 MG/ML injection 10 mg (10 mg Intramuscular Given 07/21/21 1054)  ? ? ? ?IMPRESSION / MDM / ASSESSMENT AND PLAN / ED COURSE  ?I reviewed the triage vital signs and the nursing notes. ?             ?               ? ?Differential diagnosis includes, but is not limited to, elbow fracture, lumbar compression fracture, muscular strain, contusion ? ? ? ?Patient presents after mechanical fall.  Symptoms and signs of trauma are limited to left elbow and lower back.  Overall she is nontoxic, not in severe pain.  Patient given IM Toradol in the ED for initial pain relief.  X-rays are unremarkable.  Vitals unremarkable, exam is otherwise reassuring.  We  will have patient continue Tylenol and follow-up with primary care. ?  ? ? ?FINAL CLINICAL IMPRESSION(S) / ED DIAGNOSES  ? ?Final diagnoses:  ?Traumatic hematoma of left forearm, initial encounter  ? ? ? ?Rx / DC Orders  ? ?ED Discharge Orders   ? ? None  ? ?  ? ? ? ?Note:  This document was prepared using Dragon voice recognition software and may include unintentional dictation errors. ?  ?Carrie Mew, MD ?07/21/21 1205 ? ?

## 2021-07-29 ENCOUNTER — Other Ambulatory Visit: Payer: Self-pay | Admitting: Physician Assistant

## 2021-07-29 ENCOUNTER — Ambulatory Visit
Admission: RE | Admit: 2021-07-29 | Discharge: 2021-07-29 | Disposition: A | Discharge status: Home / Self Care | Payer: Medicare Other | Source: Ambulatory Visit | Attending: Physician Assistant | Admitting: Physician Assistant

## 2021-07-29 DIAGNOSIS — K5903 Drug induced constipation: Secondary | ICD-10-CM | POA: Insufficient documentation

## 2021-07-29 DIAGNOSIS — R1084 Generalized abdominal pain: Secondary | ICD-10-CM | POA: Diagnosis present

## 2021-07-29 LAB — POCT I-STAT CREATININE: Creatinine, Ser: 0.7 mg/dL (ref 0.44–1.00)

## 2021-07-29 MED ORDER — IOHEXOL 300 MG/ML  SOLN
85.0000 mL | Freq: Once | INTRAMUSCULAR | Status: AC | PRN
  Administered 2021-07-29: 85 mL via INTRAVENOUS

## 2021-08-20 ENCOUNTER — Telehealth (HOSPITAL_COMMUNITY): Payer: Self-pay

## 2021-08-20 NOTE — Telephone Encounter (Signed)
Dr. Estanislado Pandy reviewed recent scan for possible KP. Dr. Estanislado Pandy recommends a MR thoracic t9-s1. I called [facility] to have them order. Message sent to the PA, Feliberto Gottron by the front desk staff. They will call if they have any questions. I will look out for order for Dev to review. AW

## 2021-08-21 ENCOUNTER — Other Ambulatory Visit: Payer: Self-pay | Admitting: Orthopedic Surgery

## 2021-08-21 DIAGNOSIS — S22080A Wedge compression fracture of T11-T12 vertebra, initial encounter for closed fracture: Secondary | ICD-10-CM

## 2021-09-02 ENCOUNTER — Ambulatory Visit
Admission: RE | Admit: 2021-09-02 | Discharge: 2021-09-02 | Disposition: A | Discharge status: Home / Self Care | Payer: Medicare Other | Source: Ambulatory Visit | Attending: Orthopedic Surgery | Admitting: Orthopedic Surgery

## 2021-09-02 DIAGNOSIS — S22080A Wedge compression fracture of T11-T12 vertebra, initial encounter for closed fracture: Secondary | ICD-10-CM | POA: Insufficient documentation

## 2021-09-07 ENCOUNTER — Telehealth (HOSPITAL_COMMUNITY): Payer: Self-pay

## 2021-09-07 ENCOUNTER — Other Ambulatory Visit (HOSPITAL_COMMUNITY): Payer: Self-pay | Admitting: Interventional Radiology

## 2021-09-07 NOTE — Telephone Encounter (Signed)
Ok per Dr. Luanne Bras for T11 VP.   Called pt to schedule consult/VP, no answer, left vm. AW

## 2021-09-08 ENCOUNTER — Other Ambulatory Visit (HOSPITAL_COMMUNITY): Payer: Self-pay | Admitting: Interventional Radiology

## 2021-09-08 DIAGNOSIS — S22080A Wedge compression fracture of T11-T12 vertebra, initial encounter for closed fracture: Secondary | ICD-10-CM

## 2021-09-14 ENCOUNTER — Telehealth (HOSPITAL_COMMUNITY): Payer: Self-pay

## 2021-09-14 ENCOUNTER — Other Ambulatory Visit: Payer: Self-pay | Admitting: Physician Assistant

## 2021-09-14 ENCOUNTER — Ambulatory Visit: Payer: 59

## 2021-09-14 DIAGNOSIS — R52 Pain, unspecified: Secondary | ICD-10-CM

## 2021-09-14 MED ORDER — DEXTROSE 5 % IV SOLN: 2.0000 g | Freq: Once | INTRAVENOUS | Status: DC

## 2021-09-14 NOTE — Telephone Encounter (Signed)
 Faxed over 2 biopsy request forms to find out if Laura Morrow, [name] wants a biopsy with no response. Called the office to find out. He was with a patient and will call back to confirm. AW

## 2021-09-15 ENCOUNTER — Telehealth (HOSPITAL_COMMUNITY): Payer: Self-pay

## 2021-09-15 ENCOUNTER — Ambulatory Visit (HOSPITAL_COMMUNITY): Admission: RE | Admit: 2021-09-15 | Payer: 59 | Source: Ambulatory Visit

## 2021-09-15 ENCOUNTER — Encounter (HOSPITAL_COMMUNITY): Payer: Self-pay

## 2021-09-15 ENCOUNTER — Other Ambulatory Visit: Payer: Self-pay | Admitting: Radiology

## 2021-09-15 ENCOUNTER — Other Ambulatory Visit (HOSPITAL_COMMUNITY): Payer: Self-pay | Admitting: Neuroradiology

## 2021-09-15 DIAGNOSIS — S22080A Wedge compression fracture of T11-T12 vertebra, initial encounter for closed fracture: Secondary | ICD-10-CM

## 2021-09-15 NOTE — Telephone Encounter (Signed)
Ok per Dr. Quay Burow for T11 KP/VP. AW

## 2021-09-15 NOTE — H&P (Addendum)
Chief Complaint: Patient was seen in consultation today for at the request of T 11 compression fracture   Referring Physician(s): Emmit Alexanders, [place]  Supervising Physician: Pedro Earls  Patient Status: [facility] - Out-pt  History of Present Illness: Laura Morrow is a 87 y.o. female with PMH of anxiety, osteoarthritis, colitis, HOH and S1 kyphoplasty. Pt presented to ED via [facility] c/o fall at her assisted living facility 07/21/21. Pt had lumbar spine images that showed no acute abnormality.She presented to [facility] orthopedics 07/29/21 and had CT lumbar spine that showed burst fracture at T 11. Pt was referred to IR by Emmit Alexanders, PA, for kyphoplasty consult. Procedure was approved by Pedro Earls for T 11 kyphoplasty.   Patient refers moderate rest pain that is aggravated by movement, interfering with activities of daily life. Pain has not improved with conservative treatment.  Past Medical History:  Diagnosis Date   Actinic keratosis 02/03/2017   R elbow - bx proven, LN2 07/21/20   Anxiety    Arthritis    osteoarthritis   Basal cell carcinoma 02/03/2017   R lower eyelid    Colitis    Herpes    HOH (hard of hearing)    Squamous cell carcinoma of skin 02/03/2017   L distal shin     Past Surgical History:  Procedure Laterality Date   CATARACT EXTRACTION W/PHACO Right 11/20/2019   Procedure: CATARACT EXTRACTION PHACO AND INTRAOCULAR LENS PLACEMENT ([place]) RIGHT 10.10  01:02.2;  Surgeon: Birder Robson, MD;  Location: [place];  Service: Ophthalmology;  Laterality: Right;   CATARACT EXTRACTION W/PHACO Left 12/11/2019   Procedure: CATARACT EXTRACTION PHACO AND INTRAOCULAR LENS PLACEMENT ([facility]) LEFT 8.88 00:53.4;  Surgeon: Birder Robson, MD;  Location: [place];  Service: Ophthalmology;  Laterality: Left;   OPEN REDUCTION INTERNAL FIXATION ([facility]) DISTAL RADIAL FRACTURE Left 07/09/2019   Procedure: OPEN REDUCTION  INTERNAL FIXATION ([facility]) DISTAL RADIAL FRACTURE;  Surgeon: Hessie Knows, MD;  Location: [facility] OR[facility];  Service: Orthopedics;  Laterality: Left;   SACROPLASTY N/A 07/09/2019   Procedure: SACROPLASTY;  Surgeon: Hessie Knows, MD;  Location: [facility] ORS;  Service: Orthopedics;  Laterality: N/A;    Allergies: Sulfa antibiotics  Medications: Prior to Admission medications   Medication Sig Start Date End Date Taking? Authorizing Provider  acetaminophen (TYLENOL) 500 MG tablet Take 1,000 mg by mouth 2 (two) times daily.    [provider]  cholecalciferol (VITAMIN D3) 25 MCG (1000 UNIT) tablet Take 1,000 Units by mouth daily.    [provider]  citalopram (CELEXA) 40 MG tablet Take 1 tablet by mouth daily. 07/21/18   [provider]  Cranberry-Vitamin C-Vitamin E (CRANBERRY PLUS VITAMIN C PO) Take 1 tablet by mouth daily.    [provider]  Dextran 70-Hypromellose (LUBRICANT EYE DROPS, PF, OP) Place 1 drop into both eyes 2 (two) times daily.    [provider]  oxybutynin (DITROPAN) 5 MG tablet Take 2.5 mg by mouth daily. 08/26/21   [provider]  valACYclovir (VALTREX) 500 MG tablet Take 500 mg by mouth daily. 07/21/18   [provider]     No family history on file.  Social History   Socioeconomic History   Marital status: Widowed    Spouse name: Not on file   Number of children: Not on file   Years of education: Not on file   Highest education level: Not on file  Occupational History   Not on file  Tobacco Use  Smoking status: Never   Smokeless tobacco: Never  Vaping Use   Vaping Use: Never used  Substance and Sexual Activity   Alcohol use: Yes    Comment: socially   Drug use: Not Currently   Sexual activity: Not Currently  Other Topics Concern   Not on file  Social History Narrative   Not on file   Social Determinants of Health   Financial Resource Strain: Not on file  Food Insecurity: Not on file  Transportation  Needs: Not on file  Physical Activity: Not on file  Stress: Not on file  Social Connections: Not on file     Review of Systems: A 12 point ROS discussed and pertinent positives are indicated in the HPI above.  All other systems are negative.  Review of Systems  Constitutional:  Negative for chills and fever.  Respiratory:  Negative for shortness of breath.   Cardiovascular:  Negative for chest pain and leg swelling.  Gastrointestinal:  Negative for abdominal pain, nausea and vomiting.  Musculoskeletal:  Positive for back pain.    Vital Signs: BP (!) 180/93   Pulse 76   Temp 98.1 F (36.7 C) (Temporal)   Resp 16   Ht '5\' 3"'$  (1.6 m)   Wt 140 lb (63.5 kg)   SpO2 95%   BMI 24.80 kg/m     Physical Exam Vitals reviewed.  Constitutional:      General: She is not in acute distress.    Appearance: Normal appearance. She is not ill-appearing.  HENT:     Head: Normocephalic and atraumatic.     Mouth/Throat:     Mouth: Mucous membranes are dry.     Pharynx: Oropharynx is clear.  Eyes:     Extraocular Movements: Extraocular movements intact.     Pupils: Pupils are equal, round, and reactive to light.  Cardiovascular:     Rate and Rhythm: Normal rate and regular rhythm.     Pulses: Normal pulses.     Heart sounds: Normal heart sounds.  Pulmonary:     Effort: Pulmonary effort is normal. No respiratory distress.     Breath sounds: Normal breath sounds.  Abdominal:     General: Bowel sounds are normal. There is no distension.     Palpations: Abdomen is soft.     Tenderness: There is no abdominal tenderness. There is no guarding.  Musculoskeletal:     Right lower leg: No edema.     Left lower leg: No edema.  Skin:    General: Skin is warm and dry.  Neurological:     Mental Status: She is alert and oriented to person, place, and time.  Psychiatric:        Mood and Affect: Mood normal.        Behavior: Behavior normal.        Thought Content: Thought content normal.         Judgment: Judgment normal.     Imaging: MR LUMBAR SPINE WO CONTRAST  Result Date: 09/03/2021 CLINICAL DATA:  Closed wedge compression fracture of T11 vertebra, initial encounter ([place]) S22.080A (ICD-10-CM). EXAM: MRI THORACIC AND LUMBAR SPINE WITHOUT CONTRAST TECHNIQUE: Multiplanar and multiecho pulse sequences of the thoracic and lumbar spine were obtained without intravenous contrast. COMPARISON:  CT abdomen Jul 29, 2021 FINDINGS: MRI THORACIC SPINE FINDINGS Alignment:  Exaggerated thoracic kyphosis. Vertebrae: Acute/subacute compression fracture of the T11 vertebral body with approximately 80% height loss and central retropulsion causing mild mass effect on the anterior cord surface. No  evidence of discitis or aggressive bone lesion. Cord:  Normal signal characteristics. Paraspinal and other soft tissues: Mild bilateral pleural effusion. Disc levels: At T10-11, retropulsion causes mild mass effect on the anterior cord surface and results in mild spinal canal stenosis. Facet degenerative changes contribute for mild bilateral neural foraminal narrowing at this level. No significant spinal canal or neural foraminal stenosis the thoracic levels. MRI LUMBAR SPINE FINDINGS Segmentation:  Standard. Alignment:  Trace anterolisthesis at L3-4. Vertebrae: No fracture, evidence of discitis, or bone lesion. PMMA noted in the left sacral ala. Conus medullaris and cauda equina: Conus extends to the L1 level. Conus and cauda equina appear normal. Paraspinal and other soft tissues: Negative. Disc levels: L1-2: Mild facet degenerative changes. No spinal canal or neural foraminal stenosis. L2-3: Mild facet degenerative changes. No spinal canal or neural foraminal stenosis. L3-4: Shallow disc bulge, prominent hypertrophic facet degenerative changes and mild ligamentum flavum redundancy resulting in mild spinal canal stenosis. No significant neural foraminal narrowing. L4-5: Shallow disc bulge and moderate hypertrophic facet  degenerative changes ligamentum flavum redundancy resulting in mild spinal canal stenosis with mild narrowing of the bilateral subarticular zones. No significant neural foraminal narrowing. L5-S1: Loss of disc height, disc bulge with associated osteophytic component and moderate to advanced hypertrophic facet degenerative changes without significant spinal canal or neural foraminal stenosis. IMPRESSION: MR THORACIC SPINE IMPRESSION Acute/subacute compression fracture of the T11 vertebral body with approximately 80% height loss and central retropulsion causing mild mass effect on the anterior cord surface. MR LUMBAR SPINE IMPRESSION Degenerative changes of the lumbar spine with mild spinal canal stenosis at L3-4 and L4-5 mostly related to facet hypertrophy. No high-grade spinal canal or neural foraminal stenosis. Electronically Signed   By: Pedro Earls M.D.   On: 09/03/2021 14:35   MR THORACIC SPINE WO CONTRAST  Result Date: 09/03/2021 CLINICAL DATA:  Closed wedge compression fracture of T11 vertebra, initial encounter ([place]) S22.080A (ICD-10-CM). EXAM: MRI THORACIC AND LUMBAR SPINE WITHOUT CONTRAST TECHNIQUE: Multiplanar and multiecho pulse sequences of the thoracic and lumbar spine were obtained without intravenous contrast. COMPARISON:  CT abdomen Jul 29, 2021 FINDINGS: MRI THORACIC SPINE FINDINGS Alignment:  Exaggerated thoracic kyphosis. Vertebrae: Acute/subacute compression fracture of the T11 vertebral body with approximately 80% height loss and central retropulsion causing mild mass effect on the anterior cord surface. No evidence of discitis or aggressive bone lesion. Cord:  Normal signal characteristics. Paraspinal and other soft tissues: Mild bilateral pleural effusion. Disc levels: At T10-11, retropulsion causes mild mass effect on the anterior cord surface and results in mild spinal canal stenosis. Facet degenerative changes contribute for mild bilateral neural foraminal narrowing at  this level. No significant spinal canal or neural foraminal stenosis the thoracic levels. MRI LUMBAR SPINE FINDINGS Segmentation:  Standard. Alignment:  Trace anterolisthesis at L3-4. Vertebrae: No fracture, evidence of discitis, or bone lesion. PMMA noted in the left sacral ala. Conus medullaris and cauda equina: Conus extends to the L1 level. Conus and cauda equina appear normal. Paraspinal and other soft tissues: Negative. Disc levels: L1-2: Mild facet degenerative changes. No spinal canal or neural foraminal stenosis. L2-3: Mild facet degenerative changes. No spinal canal or neural foraminal stenosis. L3-4: Shallow disc bulge, prominent hypertrophic facet degenerative changes and mild ligamentum flavum redundancy resulting in mild spinal canal stenosis. No significant neural foraminal narrowing. L4-5: Shallow disc bulge and moderate hypertrophic facet degenerative changes ligamentum flavum redundancy resulting in mild spinal canal stenosis with mild narrowing of the bilateral subarticular zones. No  significant neural foraminal narrowing. L5-S1: Loss of disc height, disc bulge with associated osteophytic component and moderate to advanced hypertrophic facet degenerative changes without significant spinal canal or neural foraminal stenosis. IMPRESSION: MR THORACIC SPINE IMPRESSION Acute/subacute compression fracture of the T11 vertebral body with approximately 80% height loss and central retropulsion causing mild mass effect on the anterior cord surface. MR LUMBAR SPINE IMPRESSION Degenerative changes of the lumbar spine with mild spinal canal stenosis at L3-4 and L4-5 mostly related to facet hypertrophy. No high-grade spinal canal or neural foraminal stenosis. Electronically Signed   By: Pedro Earls M.D.   On: 09/03/2021 14:35    Labs:  CBC: Recent Labs    09/16/21 0843  WBC 7.2  HGB 13.5  HCT 42.5  PLT 276    COAGS: Recent Labs    09/16/21 0943  INR 1.1    BMP: Recent Labs     07/29/21 1330  CREATININE 0.70    LIVER FUNCTION TESTS: No results for input(s): "BILITOT", "AST", "ALT", "ALKPHOS", "PROT", "ALBUMIN" in the last 8760 hours.  TUMOR MARKERS: No results for input(s): "AFPTM", "CEA", "CA199", "CHROMGRNA" in the last 8760 hours.  Assessment and Plan: anxiety, osteoarthritis, colitis, HOH and S1 kyphoplasty. Pt presented to ED via [facility] c/o fall at her assisted living facility 07/21/21. Pt had lumbar spine images that showed no acute abnormality.She presented to [facility] orthopedics 07/29/21 and had CT lumbar spine that showed burst fracture at T 11. Pt was referred to IR by Emmit Alexanders, PA, for kyphoplasty consult. Procedure was approved by Pedro Earls for T 11 kyphoplasty.   Risks and benefits of T 11 kyphoplasty were discussed with the patient including, but not limited to education regarding the natural healing process of compression fractures without intervention, bleeding, infection, cement migration which may cause spinal cord damage, paralysis, pulmonary embolism or even death.  This interventional procedure involves the use of X-rays and because of the nature of the planned procedure, it is possible that we will have prolonged use of X-ray fluoroscopy.  Potential radiation risks to you include (but are not limited to) the following: - A slightly elevated risk for cancer  several years later in life. This risk is typically less than 0.5% percent. This risk is low in comparison to the normal incidence of human cancer, which is 33% for women and 50% for men according to the [place]. - Radiation induced injury can include skin redness, resembling a rash, tissue breakdown / ulcers and hair loss (which can be temporary or permanent).   The likelihood of either of these occurring depends on the difficulty of the procedure and whether you are sensitive to radiation due to previous procedures, disease, or genetic  conditions.   IF your procedure requires a prolonged use of radiation, you will be notified and given written instructions for further action.  It is your responsibility to monitor the irradiated area for the 2 weeks following the procedure and to notify your physician if you are concerned that you have suffered a radiation induced injury.    All of the patient's questions were answered, patient is agreeable to proceed.  Consent signed and in chart.   Thank you for this interesting consult.  I greatly enjoyed meeting Laura Morrow and look forward to participating in their care.  A copy of this report was sent to the requesting provider on this date.  Electronically Signed: Tyson Alias, NP 09/16/2021, 10:03 AM   I  spent a total of 20 minutes in face to face in clinical consultation, greater than 50% of which was counseling/coordinating care for T 11 compression fracture.

## 2021-09-16 ENCOUNTER — Other Ambulatory Visit: Payer: Self-pay

## 2021-09-16 ENCOUNTER — Ambulatory Visit (HOSPITAL_COMMUNITY)
Admission: RE | Admit: 2021-09-16 | Discharge: 2021-09-16 | Disposition: A | Discharge status: Home / Self Care | Payer: Medicare Other | Source: Ambulatory Visit | Attending: Interventional Radiology | Admitting: Interventional Radiology

## 2021-09-16 ENCOUNTER — Other Ambulatory Visit (HOSPITAL_COMMUNITY): Payer: Self-pay | Admitting: Neuroradiology

## 2021-09-16 ENCOUNTER — Encounter (HOSPITAL_COMMUNITY): Payer: Self-pay

## 2021-09-16 DIAGNOSIS — M8008XA Age-related osteoporosis with current pathological fracture, vertebra(e), initial encounter for fracture: Secondary | ICD-10-CM | POA: Insufficient documentation

## 2021-09-16 DIAGNOSIS — S22080A Wedge compression fracture of T11-T12 vertebra, initial encounter for closed fracture: Secondary | ICD-10-CM | POA: Insufficient documentation

## 2021-09-16 DIAGNOSIS — M1990 Unspecified osteoarthritis, unspecified site: Secondary | ICD-10-CM | POA: Diagnosis not present

## 2021-09-16 DIAGNOSIS — X58XXXA Exposure to other specified factors, initial encounter: Secondary | ICD-10-CM | POA: Diagnosis not present

## 2021-09-16 DIAGNOSIS — F419 Anxiety disorder, unspecified: Secondary | ICD-10-CM | POA: Insufficient documentation

## 2021-09-16 DIAGNOSIS — R52 Pain, unspecified: Secondary | ICD-10-CM

## 2021-09-16 HISTORY — PX: IR KYPHO THORACIC WITH BONE BIOPSY: IMG5518

## 2021-09-16 LAB — CBC
HCT: 42.5 % (ref 36.0–46.0)
Hemoglobin: 13.5 g/dL (ref 12.0–15.0)
MCH: 30.2 pg (ref 26.0–34.0)
MCHC: 31.8 g/dL (ref 30.0–36.0)
MCV: 95.1 fL (ref 80.0–100.0)
Platelets: 276 10*3/uL (ref 150–400)
RBC: 4.47 MIL/uL (ref 3.87–5.11)
RDW: 14.7 % (ref 11.5–15.5)
WBC: 7.2 10*3/uL (ref 4.0–10.5)
nRBC: 0 % (ref 0.0–0.2)

## 2021-09-16 LAB — PROTIME-INR
INR: 1.1 (ref 0.8–1.2)
Prothrombin Time: 14 seconds (ref 11.4–15.2)

## 2021-09-16 MED ORDER — FENTANYL CITRATE (PF) 100 MCG/2ML IJ SOLN
INTRAMUSCULAR | Status: AC
  Filled 2021-09-16: qty 4

## 2021-09-16 MED ORDER — MIDAZOLAM HCL 5 MG/5ML IJ SOLN
INTRAMUSCULAR | Status: AC | PRN
  Administered 2021-09-16: .5 mg via INTRAVENOUS

## 2021-09-16 MED ORDER — LIDOCAINE HCL 1 % IJ SOLN
INTRAMUSCULAR | Status: AC
Start: 2021-09-16 — End: 2021-09-16
  Administered 2021-09-16: 10 mL
  Filled 2021-09-16: qty 20

## 2021-09-16 MED ORDER — SODIUM CHLORIDE 0.9 % IV SOLN: INTRAVENOUS | Status: DC

## 2021-09-16 MED ORDER — MIDAZOLAM HCL 2 MG/2ML IJ SOLN
INTRAMUSCULAR | Status: AC | PRN
  Administered 2021-09-16 (×2): .5 mg via INTRAVENOUS
  Administered 2021-09-16: 1 mg via INTRAVENOUS
  Administered 2021-09-16: .5 mg via INTRAVENOUS

## 2021-09-16 MED ORDER — CEFAZOLIN SODIUM-DEXTROSE 2-4 GM/100ML-% IV SOLN
INTRAVENOUS | Status: AC | PRN
  Administered 2021-09-16: 2 g via INTRAVENOUS

## 2021-09-16 MED ORDER — BUPIVACAINE HCL (PF) 0.5 % IJ SOLN
INTRAMUSCULAR | Status: AC
  Administered 2021-09-16: 10 mL
  Filled 2021-09-16: qty 30

## 2021-09-16 MED ORDER — FENTANYL CITRATE (PF) 100 MCG/2ML IJ SOLN
INTRAMUSCULAR | Status: AC | PRN
  Administered 2021-09-16 (×4): 25 ug via INTRAVENOUS

## 2021-09-16 MED ORDER — ACETAMINOPHEN 325 MG PO TABS: 650.0000 mg | ORAL_TABLET | Freq: Four times a day (QID) | ORAL | Status: DC | PRN

## 2021-09-16 MED ORDER — MIDAZOLAM HCL 2 MG/2ML IJ SOLN
INTRAMUSCULAR | Status: AC
  Filled 2021-09-16: qty 4

## 2021-09-16 MED ORDER — OXYCODONE-ACETAMINOPHEN 5-325 MG PO TABS: 1.0000 | ORAL_TABLET | ORAL | Status: DC | PRN

## 2021-09-16 MED ORDER — IOHEXOL 300 MG/ML  SOLN
100.0000 mL | Freq: Once | INTRAMUSCULAR | Status: AC | PRN
  Administered 2021-09-16: 20 mL

## 2021-09-16 MED ORDER — CEFAZOLIN SODIUM-DEXTROSE 2-4 GM/100ML-% IV SOLN
INTRAVENOUS | Status: AC
  Filled 2021-09-16: qty 100

## 2021-09-16 NOTE — Procedures (Signed)
INTERVENTIONAL NEURORADIOLOGY BRIEF POSTPROCEDURE NOTE  FLUOROSCOPY GUIDED T11 CORE BIOPSY AND BALLOON KYPHOPLASTY  Attending: Dr. Pedro Earls   Diagnosis: T11 compression fracture  Access site: Percutaneous, bilateral transpedicular  Anesthesia: Moderate sedation  Medication used: 2.5 mg Versed IV; 100 mcg Fentanyl IV.  Complications: None.  Estimated blood loss: Negligible.  Specimen: T11 core bone biopsy  Findings: Compression fracture of the T11 vertebral body observed. A core biopsy sample was collected, followed by bilateral transpedicular approach balloon kyphoplasty.  The patient tolerated the procedure well without incident or complication and is in stable condition.

## 2021-09-16 NOTE — Discharge Instructions (Signed)
KYPHOPLASTY/VERTEBROPLASTY DISCHARGE INSTRUCTIONS  Medications: (check all that apply)     Resume all home medications as before procedure.       Resume your (aspirin/Plavix/Coumadin) on .                  Continue your pain medications as prescribed as needed.  Over the next 3-5 days, decrease your pain medication as tolerated.  Over the counter medications (i.e. Tylenol, ibuprofen, and aleve) may be substituted once severe/moderate pain symptoms have subsided.   Wound Care: Bandages may be removed the day following your procedure.  You may get your incision wet once bandages are removed.  Bandaids may be used to cover the incisions until scab formation.  Topical ointments are optional.  If you develop a fever greater than 101 degrees, have increased skin redness at the incision sites or pus-like oozing from incisions occurring within 1 week of the procedure, contact radiology at [phone] or [phone].  Ice pack to back for 15-20 minutes 2-3 time per day for first 2-3 days post procedure.  The ice will expedite muscle healing and help with the pain from the incisions.   Activity: Bedrest today with limited activity for 24 hours post procedure.  No driving for 48 hours.  Increase your activity as tolerated after bedrest (with assistance if necessary).  Refrain from any strenuous activity or heavy lifting (greater than 10 lbs.).   Follow up: Contact radiology at [phone] or [phone] if any questions/concerns.  A physician assistant from radiology will contact you in approximately 1 week.  If a biopsy was performed at the time of your procedure, your referring physician should receive the results in usually 2-3 days.

## 2021-09-16 NOTE — Progress Notes (Signed)
Pt lying supine-ice pack applied to mid back-pt NAD-sips of water given

## 2021-09-16 NOTE — Progress Notes (Signed)
Pt ambulated to BT-steady gait-NAD-daughter at [facility] to assist with redress and d/c

## 2021-09-16 NOTE — Progress Notes (Signed)
Rcd from radiology- pt. Is a& o x3; resting flat in bed, pt advised to stay flat. Dressing to the middle of the back is C/D/ I. Pt given call bell in reach- bed in low position; monitoring

## 2021-09-17 LAB — SURGICAL PATHOLOGY

## 2021-11-09 ENCOUNTER — Ambulatory Visit (INDEPENDENT_AMBULATORY_CARE_PROVIDER_SITE_OTHER): Payer: Medicare Other | Admitting: Dermatology

## 2021-11-09 DIAGNOSIS — L820 Inflamed seborrheic keratosis: Secondary | ICD-10-CM

## 2021-11-09 DIAGNOSIS — Z85828 Personal history of other malignant neoplasm of skin: Secondary | ICD-10-CM

## 2021-11-09 DIAGNOSIS — L219 Seborrheic dermatitis, unspecified: Secondary | ICD-10-CM | POA: Diagnosis not present

## 2021-11-09 DIAGNOSIS — Q825 Congenital non-neoplastic nevus: Secondary | ICD-10-CM

## 2021-11-09 DIAGNOSIS — L304 Erythema intertrigo: Secondary | ICD-10-CM | POA: Diagnosis not present

## 2021-11-09 MED ORDER — CICLOPIROX 1 % EX SHAM: 1.0000 | MEDICATED_SHAMPOO | CUTANEOUS | 4 refills | Status: DC

## 2021-11-09 MED ORDER — HYDROCORTISONE 2.5 % EX CREA: TOPICAL_CREAM | Freq: Two times a day (BID) | CUTANEOUS | 11 refills | Status: AC | PRN

## 2021-11-09 NOTE — Progress Notes (Signed)
Follow-Up Visit   Subjective  Laura Morrow is a 87 y.o. female who presents for the following: Hx of Intertrigo (Post neck, gets red, scalp gets itchy, Ketoconazole 2% irritated area, pt did not get the HC 2.5% cr, uses otc Cortisone 10) and check spot  (L post neck, pt feels like it does not heal).   The following portions of the chart were reviewed this encounter and updated as appropriate:       Review of Systems:  No other skin or systemic complaints except as noted in HPI or Assessment and Plan.  Objective  Well appearing patient in no apparent distress; mood and affect are within normal limits.  A focused examination was performed including post neck. Relevant physical exam findings are noted in the Assessment and Plan.  lower occipital scalp Bright pink patch lower occipital scalp  Neck - Posterior Fold of post neck clear today  Scalp Mild erythema scalp  L post shoulder Pink flesh pap slightly waxy    Assessment & Plan   History of Basal Cell Carcinoma of the Skin - No evidence of recurrence today - Recommend regular full body skin exams - Recommend daily broad spectrum sunscreen SPF 30+ to sun-exposed areas, reapply every 2 hours as needed.  - Call if any new or changing lesions are noted between office visits  - R lower eyelid  History of Squamous Cell Carcinoma of the Skin - No evidence of recurrence today - Recommend regular full body skin exams - Recommend daily broad spectrum sunscreen SPF 30+ to sun-exposed areas, reapply every 2 hours as needed.  - Call if any new or changing lesions are noted between office visits - L distal shin   Nevus flammeus lower occipital scalp  Benign, observe.    Intertrigo Neck - Posterior  Improved today.  Intertrigo is a chronic recurrent rash that occurs in skin fold areas that may be associated with friction; heat; moisture; yeast; fungus; and bacteria.  It is exacerbated by increased movement / activity;  sweating; and higher atmospheric temperature.  Start HC 2.5% cr qd/bid up to 5d/wk until clear, then prn flares  Topical steroids (such as triamcinolone, fluocinolone, fluocinonide, mometasone, clobetasol, halobetasol, betamethasone, hydrocortisone) can cause thinning and lightening of the skin if they are used for too long in the same area. Your physician has selected the right strength medicine for your problem and area affected on the body. Please use your medication only as directed by your physician to prevent side effects.    hydrocortisone 2.5 % cream - Neck - Posterior Apply topically 2 (two) times daily as needed (Rash). Apply to aa rash on neck bid prn flares  Seborrheic dermatitis Scalp  With pruritus  Seborrheic Dermatitis  -  is a chronic persistent rash characterized by pinkness and scaling most commonly of the mid face but also can occur on the scalp (dandruff), ears; mid chest, mid back and groin.  It tends to be exacerbated by stress and cooler weather.  People who have neurologic disease may experience new onset or exacerbation of existing seborrheic dermatitis.  The condition is not curable but treatable and can be controlled.  Start Ciclopirox shampoo 2x/wk, let sit 10 minutes before rinsing out If not covered can start the Vanicream Zinc shampoo  Ciclopirox 1 % shampoo - Scalp Apply 1 Application topically 2 (two) times a week. Wash scalp, let sit 10 minutes and rinse out  Inflamed seborrheic keratosis L post shoulder  Vs irritated Nevus, benign-appearing  Discussed shave removal, Pt declines treatment, observe   Return if symptoms worsen or fail to improve.   I, Othelia Pulling, RMA, am acting as scribe for Brendolyn Patty, MD .   Documentation: I have reviewed the above documentation for accuracy and completeness, and I agree with the above.  Brendolyn Patty MD

## 2021-11-09 NOTE — Patient Instructions (Signed)
Due to recent changes in healthcare laws, you may see results of your pathology and/or laboratory studies on MyChart before the doctors have had a chance to review them. We understand that in some cases there may be results that are confusing or concerning to you. Please understand that not all results are received at the same time and often the doctors may need to interpret multiple results in order to provide you with the best plan of care or course of treatment. Therefore, we ask that you please give us 2 business days to thoroughly review all your results before contacting the office for clarification. Should we see a critical lab result, you will be contacted sooner.   If You Need Anything After Your Visit  If you have any questions or concerns for your doctor, please call our main line at [phone] and press option 4 to reach your doctor's medical assistant. If no one answers, please leave a voicemail as directed and we will return your call as soon as possible. Messages left after 4 pm will be answered the following business day.   You may also send us a message via MyChart. We typically respond to MyChart messages within 1-2 business days.  For prescription refills, please ask your pharmacy to contact our office. Our fax number is [phone].  If you have an urgent issue when the clinic is closed that cannot wait until the next business day, you can page your doctor at the number below.    Please note that while we do our best to be available for urgent issues outside of office hours, we are not available 24/7.   If you have an urgent issue and are unable to reach us, you may choose to seek medical care at your doctor's office, retail clinic, urgent care center, or emergency room.  If you have a medical emergency, please immediately call 911 or go to the emergency department.  Pager Numbers  - Dr. Kowalski: [phone]  - Dr. Moye: [phone]  - Dr. Stewart:  [phone]  In the event of inclement weather, please call our main line at [phone] for an update on the status of any delays or closures.  Dermatology Medication Tips: Please keep the boxes that topical medications come in in order to help keep track of the instructions about where and how to use these. Pharmacies typically print the medication instructions only on the boxes and not directly on the medication tubes.   If your medication is too expensive, please contact our office at [phone] option 4 or send us a message through MyChart.   We are unable to tell what your co-pay for medications will be in advance as this is different depending on your insurance coverage. However, we may be able to find a substitute medication at lower cost or fill out paperwork to get insurance to cover a needed medication.   If a prior authorization is required to get your medication covered by your insurance company, please allow us 1-2 business days to complete this process.  Drug prices often vary depending on where the prescription is filled and some pharmacies may offer cheaper prices.  The website www.goodrx.com contains coupons for medications through different pharmacies. The prices here do not account for what the cost may be with help from insurance (it may be cheaper with your insurance), but the website can give you the price if you did not use any insurance.  - You can print the associated coupon and take it with   your prescription to the pharmacy.  - You may also stop by our office during regular business hours and pick up a GoodRx coupon card.  - If you need your prescription sent electronically to a different pharmacy, notify our office through [facility] MyChart or by phone at [phone] option 4.     Si Usted Necesita Algo Despus de Su Visita  Tambin puede enviarnos un mensaje a travs de MyChart. Por lo general respondemos a los mensajes de MyChart en el transcurso de 1 a 2  das hbiles.  Para renovar recetas, por favor pida a su farmacia que se ponga en contacto con nuestra oficina. Nuestro nmero de fax es el [phone].  Si tiene un asunto urgente cuando la clnica est cerrada y que no puede esperar hasta el siguiente da hbil, puede llamar/localizar a su doctor(a) al nmero que aparece a continuacin.   Por favor, tenga en cuenta que aunque hacemos todo lo posible para estar disponibles para asuntos urgentes fuera del horario de oficina, no estamos disponibles las 24 horas del da, los 7 das de la semana.   Si tiene un problema urgente y no puede comunicarse con nosotros, puede optar por buscar atencin mdica  en el consultorio de su doctor(a), en una clnica privada, en un centro de atencin urgente o en una sala de emergencias.  Si tiene una emergencia mdica, por favor llame inmediatamente al 911 o vaya a la sala de emergencias.  Nmeros de bper  - Dr. Kowalski: [phone]  - Dra. Moye: [phone]  - Dra. Stewart: [phone]  En caso de inclemencias del tiempo, por favor llame a nuestra lnea principal al [phone] para una actualizacin sobre el estado de cualquier retraso o cierre.  Consejos para la medicacin en dermatologa: Por favor, guarde las cajas en las que vienen los medicamentos de uso tpico para ayudarle a seguir las instrucciones sobre dnde y cmo usarlos. Las farmacias generalmente imprimen las instrucciones del medicamento slo en las cajas y no directamente en los tubos del medicamento.   Si su medicamento es muy caro, por favor, pngase en contacto con nuestra oficina llamando al [phone] y presione la opcin 4 o envenos un mensaje a travs de MyChart.   No podemos decirle cul ser su copago por los medicamentos por adelantado ya que esto es diferente dependiendo de la cobertura de su seguro. Sin embargo, es posible que podamos encontrar un medicamento sustituto a menor costo o llenar un formulario para que el  seguro cubra el medicamento que se considera necesario.   Si se requiere una autorizacin previa para que su compaa de seguros cubra su medicamento, por favor permtanos de 1 a 2 das hbiles para completar este proceso.  Los precios de los medicamentos varan con frecuencia dependiendo del lugar de dnde se surte la receta y alguna farmacias pueden ofrecer precios ms baratos.  El sitio web www.goodrx.com tiene cupones para medicamentos de diferentes farmacias. Los precios aqu no tienen en cuenta lo que podra costar con la ayuda del seguro (puede ser ms barato con su seguro), pero el sitio web puede darle el precio si no utiliz ningn seguro.  - Puede imprimir el cupn correspondiente y llevarlo con su receta a la farmacia.  - Tambin puede pasar por nuestra oficina durante el horario de atencin regular y recoger una tarjeta de cupones de GoodRx.  - Si necesita que su receta se enve electrnicamente a una farmacia diferente, informe a nuestra oficina a travs de MyChart de [place]   o por telfono llamando al [phone] y presione la opcin 4.  

## 2022-05-20 ENCOUNTER — Other Ambulatory Visit: Payer: Self-pay

## 2022-05-20 DIAGNOSIS — L219 Seborrheic dermatitis, unspecified: Secondary | ICD-10-CM

## 2022-05-20 MED ORDER — CICLOPIROX 1 % EX SHAM: 1.0000 | MEDICATED_SHAMPOO | CUTANEOUS | 1 refills | Status: DC

## 2023-03-11 ENCOUNTER — Ambulatory Visit: Payer: Medicare Other | Admitting: Student

## 2023-03-11 ENCOUNTER — Encounter: Payer: Self-pay | Admitting: Student

## 2023-03-11 VITALS — BP 138/88 | HR 67 | Temp 97.3°F | Ht 63.0 in | Wt 142.0 lb

## 2023-03-11 DIAGNOSIS — Z8619 Personal history of other infectious and parasitic diseases: Secondary | ICD-10-CM

## 2023-03-11 DIAGNOSIS — N3941 Urge incontinence: Secondary | ICD-10-CM

## 2023-03-11 DIAGNOSIS — M810 Age-related osteoporosis without current pathological fracture: Secondary | ICD-10-CM

## 2023-03-11 DIAGNOSIS — W19XXXD Unspecified fall, subsequent encounter: Secondary | ICD-10-CM | POA: Diagnosis not present

## 2023-03-11 DIAGNOSIS — M8000XS Age-related osteoporosis with current pathological fracture, unspecified site, sequela: Secondary | ICD-10-CM

## 2023-03-11 DIAGNOSIS — S62102S Fracture of unspecified carpal bone, left wrist, sequela: Secondary | ICD-10-CM

## 2023-03-11 DIAGNOSIS — K52831 Collagenous colitis: Secondary | ICD-10-CM

## 2023-03-11 DIAGNOSIS — E2839 Other primary ovarian failure: Secondary | ICD-10-CM

## 2023-03-11 NOTE — Patient Instructions (Signed)
Please come to the clinic on Monday or Thursday before your next appointment to have labs done.
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
## 2023-03-30 ENCOUNTER — Other Ambulatory Visit: Payer: Self-pay

## 2023-03-30 MED ORDER — VALACYCLOVIR HCL 500 MG PO TABS: 500.0000 mg | ORAL_TABLET | Freq: Every day | ORAL | 3 refills | Status: AC

## 2023-04-19 LAB — VITAMIN B12: Vitamin B-12: 433 pg/mL (ref 200–1100)

## 2023-04-19 LAB — CBC WITH DIFFERENTIAL/PLATELET
Absolute Lymphocytes: 1982 {cells}/uL (ref 850–3900)
Absolute Monocytes: 632 {cells}/uL (ref 200–950)
Basophils Absolute: 38 {cells}/uL (ref 0–200)
Basophils Relative: 0.7 %
Eosinophils Absolute: 340 {cells}/uL (ref 15–500)
Eosinophils Relative: 6.3 %
HCT: 44.9 % (ref 35.0–45.0)
Hemoglobin: 14.5 g/dL (ref 11.7–15.5)
MCH: 29.8 pg (ref 27.0–33.0)
MCHC: 32.3 g/dL (ref 32.0–36.0)
MCV: 92.2 fL (ref 80.0–100.0)
MPV: 11.3 fL (ref 7.5–12.5)
Monocytes Relative: 11.7 %
Neutro Abs: 2408 {cells}/uL (ref 1500–7800)
Neutrophils Relative %: 44.6 %
Platelets: 271 10*3/uL (ref 140–400)
RBC: 4.87 10*6/uL (ref 3.80–5.10)
RDW: 13.3 % (ref 11.0–15.0)
Total Lymphocyte: 36.7 %
WBC: 5.4 10*3/uL (ref 3.8–10.8)

## 2023-04-19 LAB — COMPLETE METABOLIC PANEL WITH GFR
AG Ratio: 2 (calc) (ref 1.0–2.5)
ALT: 17 U/L (ref 6–29)
AST: 17 U/L (ref 10–35)
Albumin: 4.5 g/dL (ref 3.6–5.1)
Alkaline phosphatase (APISO): 92 U/L (ref 37–153)
BUN: 12 mg/dL (ref 7–25)
CO2: 27 mmol/L (ref 20–32)
Calcium: 10.3 mg/dL (ref 8.6–10.4)
Chloride: 107 mmol/L (ref 98–110)
Creat: 0.6 mg/dL (ref 0.60–0.95)
Globulin: 2.2 g/dL (ref 1.9–3.7)
Glucose, Bld: 83 mg/dL (ref 65–99)
Potassium: 4.4 mmol/L (ref 3.5–5.3)
Sodium: 139 mmol/L (ref 135–146)
Total Bilirubin: 0.6 mg/dL (ref 0.2–1.2)
Total Protein: 6.7 g/dL (ref 6.1–8.1)
eGFR: 87 mL/min/{1.73_m2} (ref >=60)

## 2023-04-19 LAB — VITAMIN D 25 HYDROXY (VIT D DEFICIENCY, FRACTURES): Vit D, 25-Hydroxy: 31 ng/mL (ref 30–100)

## 2023-04-19 LAB — PTH, INTACT AND CALCIUM
Calcium: 10.5 mg/dL — ABNORMAL HIGH (ref 8.6–10.4)
PTH: 108 pg/mL — ABNORMAL HIGH (ref 16–77)

## 2023-04-20 ENCOUNTER — Encounter: Payer: Self-pay | Admitting: Student

## 2023-04-20 ENCOUNTER — Ambulatory Visit: Payer: Medicare Other | Admitting: Student

## 2023-04-20 VITALS — BP 138/86 | HR 77 | Temp 97.5°F | Ht 63.0 in | Wt 142.0 lb

## 2023-04-20 DIAGNOSIS — R7989 Other specified abnormal findings of blood chemistry: Secondary | ICD-10-CM | POA: Diagnosis not present

## 2023-04-20 DIAGNOSIS — E559 Vitamin D deficiency, unspecified: Secondary | ICD-10-CM | POA: Diagnosis not present

## 2023-04-20 DIAGNOSIS — M810 Age-related osteoporosis without current pathological fracture: Secondary | ICD-10-CM | POA: Diagnosis not present

## 2023-04-20 MED ORDER — VITAMIN D (ERGOCALCIFEROL) 1.25 MG (50000 UNIT) PO CAPS: 50000.0000 [IU] | ORAL_CAPSULE | ORAL | 0 refills | Status: DC

## 2023-04-20 NOTE — Patient Instructions (Addendum)
VISIT SUMMARY:  Today, we discussed your bone health and elevated parathyroid hormone levels. You have a history of osteoporosis and have been concerned about your bone health due to slightly elevated calcium levels and borderline low vitamin D levels. We reviewed your current treatment and discussed new options to improve your condition. For your DEXA Scan, you should receive a phone call in the next 1-2 weeks. Here is the address:  [facility]  [address], [place], [place] [address]  YOUR PLAN:  -OSTEOPOROSIS: Osteoporosis is a condition where bones become weak and brittle. Your parathyroid hormone (PTH) level is elevated, which indicates increased bone resorption. We discussed the benefits of vitamin D supplementation and Reclast (zoledronic acid) infusion for bone health and pain management. You will start high-dose vitamin D (50,000 IU) once weekly for 12 weeks and follow up with an endocrinologist for the Reclast infusion. If the infusion is delayed, we may consider oral bisphosphonates.  -VITAMIN D DEFICIENCY: Vitamin D deficiency means you have lower than normal levels of vitamin D, which is important for bone health. Your current level is borderline low at 31. We will address this by prescribing high-dose vitamin D (50,000 IU) once weekly for 12 weeks. After this period, you should resume your daily vitamin D supplementation.  -GENERAL HEALTH MAINTENANCE: Your routine health maintenance is up to date except for tetanus and shingles vaccinations. You received the shingles vaccine approximately five years ago. We will administer the tetanus vaccine today and verify your shingles vaccination status to see if a booster is needed.  INSTRUCTIONS:  Please follow up with an endocrinologist for the Reclast infusion. We will monitor your vitamin D and calcium levels after 12 weeks of high-dose vitamin D supplementation. Schedule a routine follow-up  visit to reassess your bone health and pain management.

## 2023-04-20 NOTE — Progress Notes (Signed)
Location:  [facility].   Provider: Dr. Earnestine Mealing  Code Status: DNR Goals of Care:     04/20/2023    2:53 PM  Advanced Directives  Does Patient Have a Medical Advance Directive? Yes  Type of Estate agent of [place];Out of facility DNR (pink MOST or yellow form);Living will  Does patient want to make changes to medical advance directive? No - Patient declined  Copy of Healthcare Power of Attorney in Chart? No - copy requested     Chief Complaint  Patient presents with   Medical Management of Chronic Issues    Medical Management of Chronic Issues. 1 Month Follow up with Labs.    Quality Metric Gaps    To discuss need for AWV, Covid, Tdap, Zoster and Dexa.     HPI: Patient is a 89 y.o. female seen today for medical management of chronic diseases.   Discussed the use of AI scribe software for clinical note transcription with the patient, who gave verbal consent to proceed.  History of Present Illness   The patient, with osteoporosis, presents for evaluation of bone health and elevated parathyroid hormone levels.  The patient has a history of osteoporosis and is concerned about their bone health. They have had slightly elevated calcium levels at 10.5 for years and a borderline low vitamin D level at 31, despite taking 1000 IU of vitamin D daily. They have previously taken Fosamax and calcium supplements for years without perceived benefit. An updated bone scan has not been done in a couple of years, with previous scans showing poor results. They recall visiting an endocrinologist in the past due to elevated calcium levels, who suggested thyroid removal, which they declined.  Their parathyroid hormone level is elevated at 108, with normal being 77. They are not currently on any medication for their bones.  They experience back pain and use an abdominal binder to help with the pain and posture. They take Tylenol three times a day for pain  management. Various topical medications like lidocaine patches and Voltaren gel have been tried in the past but were ineffective.       Past Medical History:  Diagnosis Date   Actinic keratosis 02/03/2017   R elbow - bx proven, LN2 07/21/20   Anxiety    Arthritis    osteoarthritis   Basal cell carcinoma 02/03/2017   R lower eyelid    Colitis    Herpes    HOH (hard of hearing)    Squamous cell carcinoma of skin 02/03/2017   L distal shin     Past Surgical History:  Procedure Laterality Date   CATARACT EXTRACTION W/PHACO Right 11/20/2019   Procedure: CATARACT EXTRACTION PHACO AND INTRAOCULAR LENS PLACEMENT (IOC) RIGHT 10.10  01:02.2;  Surgeon: Galen Manila, MD;  Location: [facility];  Service: Ophthalmology;  Laterality: Right;   CATARACT EXTRACTION W/PHACO Left 12/11/2019   Procedure: CATARACT EXTRACTION PHACO AND INTRAOCULAR LENS PLACEMENT (IOC) LEFT 8.88 00:53.4;  Surgeon: Galen Manila, MD;  Location: [facility];  Service: Ophthalmology;  Laterality: Left;   IR KYPHO THORACIC WITH BONE BIOPSY  09/16/2021   OPEN REDUCTION INTERNAL FIXATION (ORIF) DISTAL RADIAL FRACTURE Left 07/09/2019   Procedure: OPEN REDUCTION INTERNAL FIXATION (ORIF) DISTAL RADIAL FRACTURE;  Surgeon: Kennedy Bucker, MD;  Location: [facility];  Service: Orthopedics;  Laterality: Left;   SACROPLASTY N/A 07/09/2019   Procedure: SACROPLASTY;  Surgeon: Kennedy Bucker, MD;  Location: [facility];  Service: Orthopedics;  Laterality: N/A;  Allergies  Allergen Reactions   Sulfa Antibiotics Swelling    Outpatient Encounter Medications as of 04/20/2023  Medication Sig   acetaminophen (TYLENOL) 500 MG tablet Take 1,000 mg by mouth 2 (two) times daily.   cholecalciferol (VITAMIN D3) 25 MCG (1000 UNIT) tablet Take 1,000 Units by mouth daily.   Ciclopirox 1 % shampoo Apply 1 Application topically 2 (two) times a week. Wash scalp, let sit 10 minutes and rinse out   citalopram (CELEXA) 40 MG tablet Take  1 tablet by mouth daily.   Cranberry-Vitamin C-Vitamin E (CRANBERRY PLUS VITAMIN C PO) Take 1 tablet by mouth daily.   Dextran 70-Hypromellose (LUBRICANT EYE DROPS, PF, OP) Place 1 drop into both eyes 2 (two) times daily.   hydrocortisone 2.5 % cream Apply topically 2 (two) times daily as needed (Rash). Apply to aa rash on neck bid prn flares   valACYclovir (VALTREX) 500 MG tablet Take 1 tablet (500 mg total) by mouth daily.   No facility-administered encounter medications on file as of 04/20/2023.    Review of Systems:  Review of Systems  Health Maintenance  Topic Date Due   DTaP/Tdap/Td (1 - Tdap) Never done   Zoster Vaccines- Shingrix (1 of 2) Never done   DEXA SCAN  Never done   Medicare Annual Wellness (AWV)  01/25/2018   COVID-19 Vaccine (6 - 2024-25 season) 03/10/2023   Pneumonia Vaccine 74+ Years old  Completed   INFLUENZA VACCINE  Completed   HPV VACCINES  Aged Out    Physical Exam: Vitals:   04/20/23 1454  BP: 138/86  Pulse: 77  Temp: (!) 97.5 F (36.4 C)  SpO2: 98%  Weight: 142 lb (64.4 kg)  Height: 5\' 3"  (1.6 m)   Body mass index is 25.15 kg/m. Physical Exam Constitutional:      Appearance: Normal appearance.  Cardiovascular:     Rate and Rhythm: Normal rate and regular rhythm.     Pulses: Normal pulses.     Heart sounds: Normal heart sounds.  Pulmonary:     Effort: Pulmonary effort is normal.  Abdominal:     General: Abdomen is flat. Bowel sounds are normal.     Palpations: Abdomen is soft.  Musculoskeletal:        General: No swelling or tenderness.  Skin:    General: Skin is warm and dry.  Neurological:     Mental Status: She is alert and oriented to person, place, and time.     Gait: Gait normal.  Psychiatric:        Mood and Affect: Mood normal.      Labs reviewed: Basic Metabolic Panel: Recent Labs    04/18/23 0823  NA 139  K 4.4  CL 107  CO2 27  GLUCOSE 83  BUN 12  CREATININE 0.60  CALCIUM 10.3  10.5*   Liver Function  Tests: Recent Labs    04/18/23 0823  AST 17  ALT 17  BILITOT 0.6  PROT 6.7   No results for input(s): "LIPASE", "AMYLASE" in the last 8760 hours. No results for input(s): "AMMONIA" in the last 8760 hours. CBC: Recent Labs    04/18/23 0823  WBC 5.4  NEUTROABS 2,408  HGB 14.5  HCT 44.9  MCV 92.2  PLT 271   Lipid Panel: No results for input(s): "CHOL", "HDL", "LDLCALC", "TRIG", "CHOLHDL", "LDLDIRECT" in the last 8760 hours. No results found for: "HGBA1C"  Procedures since last visit: No results found.  Assessment/Plan.ap Assessment and Plan    Osteoporosis  Severe osteoporosis with elevated PTH (108, normal <77) and slightly elevated calcium (10.5, normal <10.4), indicating increased bone resorption. Previous oral bisphosphonates were ineffective. Discussed benefits of vitamin D supplementation and Reclast (zoledronic acid) infusion for bone health and pain management. Patient prefers infusion over oral medication. - Prescribe high-dose vitamin D (50,000 IU) once weekly for 12 weeks - Refer to endocrinologist for Reclast infusion - Consider oral bisphosphonates if infusion is delayed  Vitamin D Deficiency Borderline low vitamin D level at 31. Adequate vitamin D is crucial for managing osteoporosis and reducing elevated PTH levels. Patient currently takes 1000 IU daily. - Prescribe high-dose vitamin D (50,000 IU) once weekly for 12 weeks - Resume daily vitamin D supplementation after 12 weeks  General Health Maintenance Routine health maintenance is up to date except for tetanus and shingles vaccinations. Patient received shingles vaccine approximately five years ago at [facility]. - Administer tetanus vaccine - Verify shingles vaccination status and administer if necessary  Follow-up - Follow up with endocrinologist for Reclast infusion - Monitor vitamin D and calcium levels after 12 weeks of high-dose vitamin D supplementation - Schedule routine follow-up visit to  reassess bone health and pain management.        Labs/tests ordered:  * No order type specified * Next appt:  Visit date not found

## 2023-05-04 ENCOUNTER — Other Ambulatory Visit: Payer: Self-pay | Admitting: *Deleted

## 2023-05-04 DIAGNOSIS — L219 Seborrheic dermatitis, unspecified: Secondary | ICD-10-CM

## 2023-05-04 MED ORDER — CITALOPRAM HYDROBROMIDE 40 MG PO TABS: 40.0000 mg | ORAL_TABLET | Freq: Every day | ORAL | 1 refills | Status: DC

## 2023-05-04 MED ORDER — CICLOPIROX 1 % EX SHAM: 1.0000 | MEDICATED_SHAMPOO | CUTANEOUS | 1 refills | Status: AC

## 2023-05-16 LAB — HM DEXA SCAN

## 2023-08-17 ENCOUNTER — Encounter: Payer: Self-pay | Admitting: Student

## 2023-08-17 ENCOUNTER — Ambulatory Visit: Payer: Medicare Other | Admitting: Student

## 2023-08-17 VITALS — BP 138/82 | HR 71 | Temp 97.0°F | Ht 63.0 in | Wt 138.6 lb

## 2023-08-17 DIAGNOSIS — M549 Dorsalgia, unspecified: Secondary | ICD-10-CM

## 2023-08-17 DIAGNOSIS — I10 Essential (primary) hypertension: Secondary | ICD-10-CM

## 2023-08-17 DIAGNOSIS — M8000XS Age-related osteoporosis with current pathological fracture, unspecified site, sequela: Secondary | ICD-10-CM

## 2023-08-17 DIAGNOSIS — E559 Vitamin D deficiency, unspecified: Secondary | ICD-10-CM

## 2023-08-17 DIAGNOSIS — F411 Generalized anxiety disorder: Secondary | ICD-10-CM

## 2023-08-17 DIAGNOSIS — Z8619 Personal history of other infectious and parasitic diseases: Secondary | ICD-10-CM

## 2023-08-17 DIAGNOSIS — G8929 Other chronic pain: Secondary | ICD-10-CM

## 2023-08-17 NOTE — Progress Notes (Signed)
 Location[facility] POS: [facility] Provider: Jann Melody  Code Status: Full Code  Goals of Care:     08/17/2023    1:21 PM  Advanced Directives  Does Patient Have a Medical Advance Directive? Yes  Type of Estate agent of [place];Living will;Out of facility DNR (pink MOST or yellow form)  Does patient want to make changes to medical advance directive? No - Patient declined  Copy of Healthcare Power of Attorney in Chart? No - copy requested     Chief Complaint  Patient presents with   Medical Management of Chronic Issues    Medical Management of Chronic Issues. 4 Month follow up    HPI: Patient is a 89 y.o. female seen today for medical management of chronic diseases.   Discussed the use of AI scribe software for clinical note transcription with the patient, who gave verbal consent to proceed.  History of Present Illness   Laura Morrow is an 89 year old female with osteoporosis who presents for follow-up on her osteoporosis treatment and management of chronic pain.  She experiences persistent body aches and balance issues, which she attributes to her osteoporosis. She received a zoledronic acid infusion in March to help prevent fractures. A recent bone density test indicated a significant risk of fractures. She has difficulty with balance and reports knee and body pain, impacting her mobility.  She has chronic knee and back pain, for which she takes Tylenol  twice daily. She has tried various treatments, including knee injections, without relief. Lidocaine  patches and Voltaren gel do not help. She takes Tylenol  in the morning and at night to aid sleep and is considering adjusting her dosing schedule to manage afternoon pain better.  She has been on citalopram  for anxiety for 27 years and is currently on a stable dose. She is dealing with significant stress due to her daughter's terminal cancer diagnosis, affecting her emotional well-being.  She takes  Valacyclovir  for herpes and has recently reduced her dosing frequency to every other day. She experienced a change in urine color and odor in March, which has improved recently. No burning, abdominal pain, or fever. She struggles to maintain adequate hydration, which may affect her kidney function.  Her diet includes chicken and eggs, and she is aware of the need to increase her protein intake to support muscle building. She is not currently reaching the recommended 90 grams of protein per day.         Past Medical History:  Diagnosis Date   Actinic keratosis 02/03/2017   R elbow - bx proven, LN2 07/21/20   Anxiety    Arthritis    osteoarthritis   Basal cell carcinoma 02/03/2017   R lower eyelid    Colitis    Herpes    HOH (hard of hearing)    Squamous cell carcinoma of skin 02/03/2017   L distal shin     Past Surgical History:  Procedure Laterality Date   CATARACT EXTRACTION W/PHACO Right 11/20/2019   Procedure: CATARACT EXTRACTION PHACO AND INTRAOCULAR LENS PLACEMENT (IOC) RIGHT 10.10  01:02.2;  Surgeon: Clair Crews, MD;  Location: [facility];  Service: Ophthalmology;  Laterality: Right;   CATARACT EXTRACTION W/PHACO Left 12/11/2019   Procedure: CATARACT EXTRACTION PHACO AND INTRAOCULAR LENS PLACEMENT (IOC) LEFT 8.88 00:53.4;  Surgeon: Clair Crews, MD;  Location: [facility];  Service: Ophthalmology;  Laterality: Left;   IR KYPHO THORACIC WITH BONE BIOPSY  09/16/2021   OPEN REDUCTION INTERNAL FIXATION (ORIF) DISTAL  RADIAL FRACTURE Left 07/09/2019   Procedure: OPEN REDUCTION INTERNAL FIXATION (ORIF) DISTAL RADIAL FRACTURE;  Surgeon: Molli Angelucci, MD;  Location: [facility];  Service: Orthopedics;  Laterality: Left;   SACROPLASTY N/A 07/09/2019   Procedure: SACROPLASTY;  Surgeon: Molli Angelucci, MD;  Location: [facility];  Service: Orthopedics;  Laterality: N/A;    Allergies  Allergen Reactions   Sulfa Antibiotics Swelling    Outpatient Encounter  Medications as of 08/17/2023  Medication Sig   acetaminophen  (TYLENOL ) 500 MG tablet Take 1,000 mg by mouth 2 (two) times daily.   cholecalciferol  (VITAMIN D3) 25 MCG (1000 UNIT) tablet Take 1,000 Units by mouth daily.   Ciclopirox  1 % shampoo Apply 1 Application topically 2 (two) times a week. Wash scalp, let sit 10 minutes and rinse out   citalopram  (CELEXA ) 40 MG tablet Take 1 tablet (40 mg total) by mouth daily.   Cranberry-Vitamin C-Vitamin E (CRANBERRY PLUS VITAMIN C PO) Take 1 tablet by mouth daily.   Dextran 70-Hypromellose (LUBRICANT EYE DROPS, PF, OP) Place 1 drop into both eyes 2 (two) times daily.   hydrocortisone  2.5 % cream Apply topically 2 (two) times daily as needed (Rash). Apply to aa rash on neck bid prn flares   valACYclovir  (VALTREX ) 500 MG tablet Take 1 tablet (500 mg total) by mouth daily.   Vitamin D , Ergocalciferol , (DRISDOL ) 1.25 MG (50000 UNIT) CAPS capsule Take 1 capsule (50,000 Units total) by mouth every 7 (seven) days.   No facility-administered encounter medications on file as of 08/17/2023.    Review of Systems:  Review of Systems  Health Maintenance  Topic Date Due   DTaP/Tdap/Td (1 - Tdap) Never done   Zoster Vaccines- Shingrix (1 of 2) Never done   Medicare Annual Wellness (AWV)  01/25/2018   COVID-19 Vaccine (6 - Moderna risk 2024-25 season) 07/14/2023   INFLUENZA VACCINE  10/21/2023   Pneumonia Vaccine 30+ Years old  Completed   DEXA SCAN  Completed   HPV VACCINES  Aged Out   Meningococcal B Vaccine  Aged Out    Physical Exam: Vitals:   08/17/23 1318  BP: 138/82  Pulse: 71  Temp: (!) 97 F (36.1 C)  SpO2: 96%  Weight: 138 lb 9.6 oz (62.9 kg)  Height: 5\' 3"  (1.6 m)   Body mass index is 24.55 kg/m. Physical Exam Constitutional:      Appearance: Normal appearance.  Cardiovascular:     Rate and Rhythm: Normal rate and regular rhythm.     Pulses: Normal pulses.     Heart sounds: Normal heart sounds.  Pulmonary:     Effort: Pulmonary  effort is normal.  Abdominal:     General: Abdomen is flat. Bowel sounds are normal.     Palpations: Abdomen is soft.  Musculoskeletal:        General: No swelling or tenderness.  Skin:    General: Skin is warm and dry.  Neurological:     Mental Status: She is alert and oriented to person, place, and time.     Gait: Gait normal.  Psychiatric:        Mood and Affect: Mood normal.    Labs reviewed: Basic Metabolic Panel: Recent Labs    04/18/23 0823  NA 139  K 4.4  CL 107  CO2 27  GLUCOSE 83  BUN 12  CREATININE 0.60  CALCIUM 10.3  10.5*   Liver Function Tests: Recent Labs    04/18/23 0823  AST 17  ALT 17  BILITOT 0.6  PROT 6.7   No results for input(s): "LIPASE", "AMYLASE" in the last 8760 hours. No results for input(s): "AMMONIA" in the last 8760 hours. CBC: Recent Labs    04/18/23 0823  WBC 5.4  NEUTROABS 2,408  HGB 14.5  HCT 44.9  MCV 92.2  PLT 271   Lipid Panel: No results for input(s): "CHOL", "HDL", "LDLCALC", "TRIG", "CHOLHDL", "LDLDIRECT" in the last 8760 hours. No results found for: "HGBA1C"  Procedures since last visit: No results found. Results   LABS Vitamin D : 31 ng/mL (January 2025)  RADIOLOGY Bone density test: 12% ten-year fracture risk, 31% major osteoporotic fracture risk      Assessment/Plan    Osteoporosis Severe osteoporosis with a 12% ten-year fracture risk and 31% major fracture risk. Recent zoledronic acid infusion in March to prevent fractures. Reports balance issues and generalized body pain. Exercise program for osteoporosis starting in June is recommended to improve mobility and strength. Discussion about the potential benefits of physical therapy and exercise in improving posture and muscle strength, with emphasis on starting with low weights and gradually increasing intensity. - Encourage participation in osteoporosis exercise program starting in June. - Ensure adequate protein intake to support muscle building, aiming  for 90 grams per day. - Continue vitamin D  supplementation and monitor levels. - Recheck bone density as needed.  Chronic back pain Chronic back pain associated with severe osteoporosis. Pain management is challenging, and she reports difficulty with mobility. Recent zoledronic acid infusion may help reduce bone pain. Emphasis on the importance of exercise to improve back strength and posture. - Continue Tylenol  for pain management as needed. - Encourage participation in osteoporosis exercise program to improve back strength and posture.  Chronic knee pain Chronic knee pain with multiple interventions. Pain persists despite previous treatments. Reports difficulty with balance and mobility due to knee pain. Surgery is not considered due to osteoporosis and associated risks. - Recommend Tylenol  two extra strength every eight hours for pain management. - Consider physical therapy exercises to strengthen muscles and improve balance.  Anxiety Long-standing anxiety managed with citalopram  for 27 years. She is currently on a well-managed dose and is not considering dose reduction due to current stressors, including daughter's terminal illness. - Continue current dose of citalopram . - Consider grief counseling for support during daughter's terminal illness.  Herpes simplex infection Herpes simplex infection. She has been reducing valacyclovir  dosage to every other day without recent outbreaks. - Continue valacyclovir  every other day. - Monitor for any signs of outbreak and adjust dosage as needed.      Labs/tests ordered:  * No order type specified * Next appt:  10/19/2023

## 2023-08-17 NOTE — Patient Instructions (Signed)
 VISIT SUMMARY:  During your visit, we discussed your ongoing management of osteoporosis, chronic pain, anxiety, and herpes simplex infection. We reviewed your current treatments and made some adjustments to help improve your overall well-being.  YOUR PLAN:  -OSTEOPOROSIS: Osteoporosis is a condition where bones become weak and are more likely to break. You have a high risk of fractures, and we discussed starting an exercise program in June to improve your mobility and strength. It's important to ensure you get enough protein, aiming for 90 grams per day, and continue taking your vitamin D  supplements. We will monitor your bone density as needed.  -CHRONIC BACK PAIN: Chronic back pain is ongoing pain in your back, often related to your osteoporosis. We recommend continuing to take Tylenol  as needed and participating in the osteoporosis exercise program to help improve your back strength and posture.  -CHRONIC KNEE PAIN: Chronic knee pain is persistent pain in your knees. Despite previous treatments, the pain continues to affect your balance and mobility. We recommend taking two extra strength Tylenol  every eight hours and considering physical therapy exercises to strengthen your muscles and improve balance.  -ANXIETY: Anxiety is a feeling of worry or fear that can be long-lasting. You have been managing your anxiety with citalopram  for many years, and we recommend continuing your current dose. Given the stress related to your daughter's illness, grief counseling may be beneficial.  -HERPES SIMPLEX INFECTION: Herpes simplex infection is a viral infection that can cause sores. You have been taking valacyclovir  every other day without recent outbreaks. Continue this dosage and monitor for any signs of an outbreak, adjusting the dosage if needed.  INSTRUCTIONS:  Please follow up with the osteoporosis exercise program starting in June. Continue with your current medications and pain management  strategies. Consider physical therapy for your knee pain and grief counseling for additional support. We will monitor your bone density and vitamin D  levels as needed.

## 2023-10-17 ENCOUNTER — Ambulatory Visit: Payer: Self-pay | Admitting: Student

## 2023-10-17 LAB — COMPLETE METABOLIC PANEL WITHOUT GFR
AG Ratio: 2 (calc) (ref 1.0–2.5)
ALT: 12 U/L (ref 6–29)
AST: 16 U/L (ref 10–35)
Albumin: 4.6 g/dL (ref 3.6–5.1)
Alkaline phosphatase (APISO): 54 U/L (ref 37–153)
BUN: 12 mg/dL (ref 7–25)
CO2: 27 mmol/L (ref 20–32)
Calcium: 10 mg/dL (ref 8.6–10.4)
Chloride: 104 mmol/L (ref 98–110)
Creat: 0.66 mg/dL (ref 0.60–0.95)
Globulin: 2.3 g/dL (ref 1.9–3.7)
Glucose, Bld: 73 mg/dL (ref 65–99)
Potassium: 4.4 mmol/L (ref 3.5–5.3)
Sodium: 138 mmol/L (ref 135–146)
Total Bilirubin: 0.6 mg/dL (ref 0.2–1.2)
Total Protein: 6.9 g/dL (ref 6.1–8.1)

## 2023-10-17 LAB — CBC WITH DIFFERENTIAL/PLATELET
Absolute Lymphocytes: 2250 {cells}/uL (ref 850–3900)
Absolute Monocytes: 731 {cells}/uL (ref 200–950)
Basophils Absolute: 52 {cells}/uL (ref 0–200)
Basophils Relative: 0.9 %
Eosinophils Absolute: 302 {cells}/uL (ref 15–500)
Eosinophils Relative: 5.2 %
HCT: 45.2 % — ABNORMAL HIGH (ref 35.0–45.0)
Hemoglobin: 14.7 g/dL (ref 11.7–15.5)
MCH: 30.6 pg (ref 27.0–33.0)
MCHC: 32.5 g/dL (ref 32.0–36.0)
MCV: 94.2 fL (ref 80.0–100.0)
MPV: 10.9 fL (ref 7.5–12.5)
Monocytes Relative: 12.6 %
Neutro Abs: 2465 {cells}/uL (ref 1500–7800)
Neutrophils Relative %: 42.5 %
Platelets: 266 Thousand/uL (ref 140–400)
RBC: 4.8 Million/uL (ref 3.80–5.10)
RDW: 12.5 % (ref 11.0–15.0)
Total Lymphocyte: 38.8 %
WBC: 5.8 Thousand/uL (ref 3.8–10.8)

## 2023-10-17 LAB — VITAMIN B12: Vitamin B-12: 401 pg/mL (ref 200–1100)

## 2023-10-17 LAB — LIPID PANEL
Cholesterol: 198 mg/dL (ref <200)
HDL: 69 mg/dL (ref >=50)
LDL Cholesterol (Calc): 109 mg/dL — ABNORMAL HIGH
Non-HDL Cholesterol (Calc): 129 mg/dL (ref <130)
Total CHOL/HDL Ratio: 2.9 (calc) (ref <5.0)
Triglycerides: 100 mg/dL (ref <150)

## 2023-10-17 LAB — TSH: TSH: 1.84 m[IU]/L (ref 0.40–4.50)

## 2023-10-17 LAB — VITAMIN D 25 HYDROXY (VIT D DEFICIENCY, FRACTURES): Vit D, 25-Hydroxy: 32 ng/mL (ref 30–100)

## 2023-10-19 ENCOUNTER — Ambulatory Visit: Admitting: Student

## 2023-10-19 ENCOUNTER — Encounter: Payer: Self-pay | Admitting: Student

## 2023-10-19 VITALS — BP 138/88 | HR 70 | Temp 98.5°F | Ht 63.0 in | Wt 141.0 lb

## 2023-10-19 DIAGNOSIS — I10 Essential (primary) hypertension: Secondary | ICD-10-CM | POA: Diagnosis not present

## 2023-10-19 DIAGNOSIS — F411 Generalized anxiety disorder: Secondary | ICD-10-CM | POA: Diagnosis not present

## 2023-10-19 DIAGNOSIS — M8000XS Age-related osteoporosis with current pathological fracture, unspecified site, sequela: Secondary | ICD-10-CM

## 2023-10-19 DIAGNOSIS — Z0000 Encounter for general adult medical examination without abnormal findings: Secondary | ICD-10-CM

## 2023-10-19 MED ORDER — CITALOPRAM HYDROBROMIDE 40 MG PO TABS: 40.0000 mg | ORAL_TABLET | Freq: Every day | ORAL | 1 refills | Status: DC

## 2023-10-19 NOTE — Patient Instructions (Addendum)
 VISIT SUMMARY:  You had your annual wellness visit today, and we reviewed your recent lab results. Your mood and anxiety levels are stable, and you are considering reducing your medication dosage. Your routine lab tests showed normal kidney function, electrolytes, liver function, and blood counts. Your vitamin D  level was slightly low, and your vitamin B12 level was borderline. Your cholesterol levels were reviewed, with a slightly elevated LDL. We also discussed your alcohol consumption and its impact on your health.  YOUR PLAN:  -ADULT WELLNESS VISIT: Your annual wellness visit showed no new concerns, and your lab results were within normal limits. We discussed fall risk, mood, and preventative actions. Please ensure your vaccinations are up to date, including Tdap and Shingrix. We also talked about advanced directives and healthcare power of attorney paperwork. Additionally, we reviewed the COVID-19 vaccination schedule and recommendations.  -OSTEOPOROSIS, ON TREATMENT: Osteoporosis is a condition where bones become weak and brittle. Your treatment is effective, as indicated by decreased alkaline phosphatase and reduced calcium levels, showing reduced bone turnover.  -VITAMIN D  INSUFFICIENCY: Vitamin D  insufficiency means your vitamin D  levels are lower than ideal. Your current level is 32, which is slightly low. You are currently taking 1000 IU of vitamin D3 daily, and we recommend increasing this to 2000 IU daily.  -BORDERLINE VITAMIN B12 LEVEL: A borderline vitamin B12 level means your B12 is just above the deficiency threshold. Vitamin B12 is important for nerve function and blood cell production. We discussed the signs of deficiency and recommended considering an over-the-counter vitamin B12 supplement, such as sublingual tablets or multivitamins.  -HYPERLIPIDEMIA: Hyperlipidemia means you have high levels of fats (lipids) in your blood. Your total cholesterol is 198, HDL is 69, and LDL is  slightly elevated. We discussed the option of statin therapy, but you prefer not to start it. We also talked about the impact of alcohol on cholesterol levels and the importance of balancing enjoyment with health risks.  -DEPRESSION, STABLE: Your depression and anxiety are well-managed. We discussed the possibility of reducing your current medication dosage, as you feel confident in managing your mood without the current dose.  INSTRUCTIONS:  Please follow up with the following: Ensure your vaccinations are up to date, including Tdap and Shingrix. Bring your healthcare power of attorney paperwork for scanning into the chart. Consider increasing your vitamin D3 intake to 2000 IU daily and think about taking an over-the-counter vitamin B12 supplement. Continue to monitor your mood and anxiety levels, and we can discuss any changes to your medication dosage at your next visit.  Fat and Cholesterol Restricted Eating Plan Eating a diet that limits fat and cholesterol may help lower your risk for heart disease and other conditions. Your body needs fat and cholesterol for basic functions, but eating too much of these things can be harmful to your health. Your health care provider may order lab tests to check your blood fat (lipid) and cholesterol levels. This helps your health care provider understand your risk for certain conditions and whether you need to make diet changes. Work with your health care provider or dietitian to make an eating plan that is right for you. Your plan includes: Limit your fat intake to ______% or less of your total calories a day. This is ______g of fat per day. Limit your saturated fat intake to ______% or less of your total calories a day. This is ______g of saturated fat per day. Limit the amount of cholesterol in your diet to less than _________mg  a day. Eat ___________ g of fiber a day. What are tips for following this plan? General guidelines If you are overweight, work  with your health care provider to lose weight safely. Losing just 5-10% of your body weight can improve your overall health and help prevent diseases such as diabetes and heart disease. Avoid: Foods with added sugar. Fried foods. Foods that contain partially hydrogenated oils, including stick margarine, some tub margarines, cookies, crackers, and other baked goods. If you drink alcohol: Limit how much you have to: 0-1 drink a day for women who are not pregnant. 0-2 drinks a day for men. Know how much alcohol is in a drink. In the [place] one drink equals one 12 oz bottle of beer (355 mL), one 5 oz glass of wine (148 mL), or one 1 oz glass of hard liquor (44 mL). Reading food labels Check food labels for: Trans fats or partially hydrogenated oils. Avoid foods that contain these. High amounts of saturated fat. Choose foods that are low in saturated fat (less than 2 g). The amount of cholesterol in each serving. The amount of fiber in each serving. Choose foods with healthy fats, such as: Monounsaturated and polyunsaturated fats. These include olive and canola oil, flaxseeds, walnuts, almonds, and seeds. Omega-3 fats. These are found in foods such as salmon, mackerel, sardines, tuna, flaxseed oil, and ground flaxseeds. Choose grain products that have whole grains. Look for the word whole as the first word in the ingredient list. Cooking Cook foods using methods other than frying. Baking, boiling, grilling, and broiling are some healthy options. Eat more home-cooked food and less restaurant, buffet, and fast food. Avoid cooking using saturated fats. Animal sources of saturated fats include meats, butter, and cream. Plant sources of saturated fats include palm oil, palm kernel oil, and coconut oil. Meal planning  At meals, imagine dividing your plate into fourths: Fill one-half of your plate with vegetables, green salads, and fruit. Fill one-fourth of your plate with whole grains. Fill  one-fourth of your plate with lean protein foods. Eat fish that is high in omega-3 fats at least two times a week. Eat more foods that contain fiber, such as whole grains, beans, apples, pears, berries, broccoli, carrots, peas, and barley. These foods help promote healthy cholesterol levels in the blood. What foods should I eat? Fruits All fresh, canned (in natural juice), or frozen fruits. Vegetables Fresh or frozen vegetables (raw, steamed, roasted, or grilled). Green salads. Grains Whole grains, such as whole wheat or whole grain breads, crackers, cereals, and pasta. Unsweetened oatmeal, bulgur, barley, quinoa, or brown rice. Corn or whole wheat flour tortillas. Meats and other proteins Ground beef (85% or leaner), grass-fed beef, or beef trimmed of fat. Skinless chicken or [place]. Ground chicken or [place]. Pork trimmed of fat. All fish and seafood. Egg whites. Dried beans, peas, or lentils. Unsalted nuts or seeds. Unsalted canned beans. Natural nut butters without added sugar and oil. Dairy Low-fat or nonfat dairy products, such as skim or 1% milk, 2% or reduced-fat cheeses, low-fat and fat-free ricotta or cottage cheese, or plain low-fat and nonfat yogurt. Fats and oils Tub margarine without trans fats. Light or reduced-fat mayonnaise and salad dressings. Avocado. Olive, canola, sesame, or safflower oils. The items listed above may not be a complete list of foods and beverages you can eat. Contact a dietitian for more information. What foods should I avoid? Fruits Canned fruit in heavy syrup. Fruit in cream or butter sauce. Fried fruit. Vegetables Vegetables  cooked in cheese, cream, or butter sauce. Fried vegetables. Grains White bread. White pasta. White rice. Cornbread. Bagels, pastries, and croissants. Crackers and snack foods that contain trans fat and hydrogenated oils. Meats and other proteins Fatty cuts of meat. Ribs, chicken wings, bacon, sausage, bologna, salami, chitterlings,  fatback, hot dogs, bratwurst, and packaged lunch meats. Liver and organ meats. Whole eggs and egg yolks. Chicken and [place] with skin. Fried meat. Dairy Whole or 2% milk, cream, half-and-half, and cream cheese. Whole milk cheeses. Whole-fat or sweetened yogurt. Full-fat cheeses. Nondairy creamers and whipped toppings. Processed cheese, cheese spreads, and cheese curds. Fats and oils Butter, stick margarine, lard, shortening, ghee, or bacon fat. Coconut, palm kernel, and palm oils. Beverages Alcohol. Sugar-sweetened drinks such as sodas, lemonade, and fruit drinks. Sweets and desserts Corn syrup, sugars, honey, and molasses. Candy. Jam and jelly. Syrup. Sweetened cereals. Cookies, pies, cakes, donuts, muffins, and ice cream. The items listed above may not be a complete list of foods and beverages you should avoid. Contact a dietitian for more information. Summary Your body needs fat and cholesterol for basic functions. However, eating too much of these things can be harmful to your health. Work with your health care provider and dietitian to follow a diet that limits fat and cholesterol. Doing this may help lower your risk for heart disease and other conditions. Choose healthy fats, such as monounsaturated and polyunsaturated fats, and foods high in omega-3 fatty acids. Eat fiber-rich foods, such as whole grains, beans, peas, fruits, and vegetables. Limit or avoid alcohol, fried foods, and foods high in saturated fats, partially hydrogenated oils, and sugar. This information is not intended to replace advice given to you by your health care provider. Make sure you discuss any questions you have with your health care provider. Document Revised: 07/18/2020 Document Reviewed: 07/18/2020 Elsevier Patient Education  2024 [employer].

## 2023-10-19 NOTE — Progress Notes (Signed)
 Location:  [facility] POS: [facility] Provider: ABDUL  Code Status: DNR Goals of Care:     10/19/2023    2:50 PM  Advanced Directives  Does Patient Have a Medical Advance Directive? Yes  Type of Estate agent of [place];Out of facility DNR (pink MOST or yellow form)  Does patient want to make changes to medical advance directive? No - Patient declined  Copy of Healthcare Power of Attorney in Chart? No - copy requested     Chief Complaint  Patient presents with   Medicare Wellness    AWV     HPI: Patient is a 89 y.o. female seen today for medical management of chronic diseases.   Discussed the use of AI scribe software for clinical note transcription with the patient, who gave verbal consent to proceed.  History of Present Illness   Laura Morrow is an 89 year old female who presents for an annual wellness visit and lab review.  Routine lab tests conducted on October 17, 2023, showed normal kidney function tests, including creatinine and BUN. Electrolytes such as sodium, potassium, and chloride were within normal ranges. CO2 level was normal. Calcium and protein levels were also normal.  Her vitamin D  level was on the lower side of normal at 32. She has been taking a regular dose of 1000 units daily. She does not get outside much.  Vitamin B12 level is 400, which is just above the threshold for deficiency. She does not take a multivitamin.  Cholesterol levels were reviewed, with total cholesterol at 198, HDL at 69, and LDL value as reported. She consumes four ounces of red wine with her evening meal.  Thyroid function, as indicated by TSH, is normal at 1.84. Blood tests show normal white blood cell count, hemoglobin, and platelets. Hematocrit is slightly higher than normal.  She received the Shingrix vaccine approximately five years ago and is unsure if she completed the two-shot series. She discusses her preferences for advanced directives and has a  yellow slip at home indicating her wishes.         Past Medical History:  Diagnosis Date   Actinic keratosis 02/03/2017   R elbow - bx proven, LN2 07/21/20   Anxiety    Arthritis    osteoarthritis   Basal cell carcinoma 02/03/2017   R lower eyelid    Colitis    Herpes    HOH (hard of hearing)    Squamous cell carcinoma of skin 02/03/2017   L distal shin     Past Surgical History:  Procedure Laterality Date   CATARACT EXTRACTION W/PHACO Right 11/20/2019   Procedure: CATARACT EXTRACTION PHACO AND INTRAOCULAR LENS PLACEMENT (IOC) RIGHT 10.10  01:02.2;  Surgeon: Jaye Fallow, MD;  Location: [facility];  Service: Ophthalmology;  Laterality: Right;   CATARACT EXTRACTION W/PHACO Left 12/11/2019   Procedure: CATARACT EXTRACTION PHACO AND INTRAOCULAR LENS PLACEMENT (IOC) LEFT 8.88 00:53.4;  Surgeon: Jaye Fallow, MD;  Location: [facility];  Service: Ophthalmology;  Laterality: Left;   IR KYPHO THORACIC WITH BONE BIOPSY  09/16/2021   OPEN REDUCTION INTERNAL FIXATION (ORIF) DISTAL RADIAL FRACTURE Left 07/09/2019   Procedure: OPEN REDUCTION INTERNAL FIXATION (ORIF) DISTAL RADIAL FRACTURE;  Surgeon: Kathlynn Sharper, MD;  Location: [facility];  Service: Orthopedics;  Laterality: Left;   SACROPLASTY N/A 07/09/2019   Procedure: SACROPLASTY;  Surgeon: Kathlynn Sharper, MD;  Location: [facility];  Service: Orthopedics;  Laterality: N/A;    Allergies  Allergen Reactions  Sulfa Antibiotics Swelling    Outpatient Encounter Medications as of 10/19/2023  Medication Sig   acetaminophen  (TYLENOL ) 500 MG tablet Take 1,000 mg by mouth 2 (two) times daily.   cholecalciferol  (VITAMIN D3) 25 MCG (1000 UNIT) tablet Take 1,000 Units by mouth daily.   Ciclopirox  1 % shampoo Apply 1 Application topically 2 (two) times a week. Wash scalp, let sit 10 minutes and rinse out   Cranberry-Vitamin C-Vitamin E (CRANBERRY PLUS VITAMIN C PO) Take 1 tablet by mouth daily.   Dextran 70-Hypromellose  (LUBRICANT EYE DROPS, PF, OP) Place 1 drop into both eyes 2 (two) times daily.   hydrocortisone  2.5 % cream Apply topically 2 (two) times daily as needed (Rash). Apply to aa rash on neck bid prn flares   valACYclovir  (VALTREX ) 500 MG tablet Take 1 tablet (500 mg total) by mouth daily.   [DISCONTINUED] citalopram  (CELEXA ) 40 MG tablet Take 1 tablet (40 mg total) by mouth daily.   [DISCONTINUED] Vitamin D , Ergocalciferol , (DRISDOL ) 1.25 MG (50000 UNIT) CAPS capsule Take 1 capsule (50,000 Units total) by mouth every 7 (seven) days.   citalopram  (CELEXA ) 40 MG tablet Take 1 tablet (40 mg total) by mouth daily.   No facility-administered encounter medications on file as of 10/19/2023.    Review of Systems:  Review of Systems  Health Maintenance  Topic Date Due   DTaP/Tdap/Td (1 - Tdap) Never done   Zoster Vaccines- Shingrix (1 of 2) Never done   COVID-19 Vaccine (6 - Moderna risk 2024-25 season) 07/14/2023   INFLUENZA VACCINE  10/21/2023   Medicare Annual Wellness (AWV)  10/18/2024   Pneumococcal Vaccine: 50+ Years  Completed   DEXA SCAN  Completed   Hepatitis B Vaccines  Aged Out   HPV VACCINES  Aged Out   Meningococcal B Vaccine  Aged Out    Physical Exam: Vitals:   10/19/23 1446  BP: 138/88  Pulse: 70  Temp: 98.5 F (36.9 C)  SpO2: 94%  Weight: 141 lb (64 kg)  Height: 5' 3 (1.6 m)   Body mass index is 24.98 kg/m. Physical Exam Pulmonary:     Effort: Pulmonary effort is normal.  Neurological:     General: No focal deficit present.     Mental Status: She is alert. Mental status is at baseline.     Labs reviewed: Basic Metabolic Panel: Recent Labs    04/18/23 0823 10/17/23 0815  NA 139 138  K 4.4 4.4  CL 107 104  CO2 27 27  GLUCOSE 83 73  BUN 12 12  CREATININE 0.60 0.66  CALCIUM 10.3  10.5* 10.0  TSH  --  1.84   Liver Function Tests: Recent Labs    04/18/23 0823 10/17/23 0815  AST 17 16  ALT 17 12  BILITOT 0.6 0.6  PROT 6.7 6.9   No results for  input(s): LIPASE, AMYLASE in the last 8760 hours. No results for input(s): AMMONIA in the last 8760 hours. CBC: Recent Labs    04/18/23 0823 10/17/23 0815  WBC 5.4 5.8  NEUTROABS 2,408 2,465  HGB 14.5 14.7  HCT 44.9 45.2*  MCV 92.2 94.2  PLT 271 266   Lipid Panel: Recent Labs    10/17/23 0815  CHOL 198  HDL 69  LDLCALC 109*  TRIG 100  CHOLHDL 2.9   No results found for: HGBA1C  Procedures since last visit: No results found. Results   LABS Blood Glucose: 7 mmol/L (10/17/2023) BUN: 12 mg/dL (92/71/7974) Creatinine: 0.66 mg/dL (92/71/7974) Sodium:  138 mmol/L (10/17/2023) Potassium: 4.4 mmol/L (10/17/2023) Chloride: 100 mmol/L (10/17/2023) CO2: 27 mmol/L (10/17/2023) Calcium: 10.0 mg/dL (92/71/7974) Albumin: 4.6 g/dL (92/71/7974) Bilirubin: 0.6 mg/dL (92/71/7974) Alkaline Phosphatase: 54 U/L (10/17/2023) AST: 16 U/L (10/17/2023) ALT: 12 U/L (10/17/2023) Vitamin D : 32 ng/mL (10/17/2023) Vitamin B12: 400 pg/mL (10/17/2023) Total Cholesterol: 198 mg/dL (92/71/7974) HDL: 69 mg/dL (92/71/7974) TSH: 8.15 IU/mL (10/17/2023) WBC: 5.8 x10^3/L (10/17/2023) Hemoglobin: 14.7 g/dL (92/71/7974) Hematocrit: 45.2% (10/17/2023) Platelets: 166 x10^3/L (10/17/2023)      Assessment/Plan     Adult Wellness Visit Routine annual wellness visit with labs within normal limits. No new concerns raised. - Ensure vaccinations are up to date, including Technifex, Shingrix, and COVID-19 vaccines as per CDC guidelines. - Discuss advanced directives and ensure documentation is updated in the chart. - Encourage bringing healthcare power of attorney paperwork for scanning into the chart.  Osteoporosis Alkaline phosphatase decreased from 92 to 54, and calcium levels decreased, indicating effective treatment.  Hyperlipidemia Total cholesterol is 198, HDL is 69, and LDL is slightly elevated. - Discussed potential benefits and risks of statin therapy, including prevention of heart  attack, stroke, and possibly memory changes.  Vitamin D  insufficiency Vitamin D  level is normal but on the lower side at 32. Limited outdoor activity may contribute to lower levels. - Increase vitamin D3 intake to 2000 units daily.  Depression Mood reported as good. Discussed the possibility of dose reduction for current treatment, but no changes made.  General Health Maintenance Reviewed preventative actions and vaccinations. No further colorectal cancer screening or mammograms required. - Ensure bone density screening is up to date.  Goals of Care Discussed advanced directives and her preferences in the event of a medical emergency. She expressed a preference not to be resuscitated if found without a pulse. - Ensure advanced directives are documented in the chart. - Encourage bringing a copy of the yellow slip and healthcare power of attorney paperwork for scanning into the chart.       Labs/tests ordered:  * No order type specified * Next appt:  Visit date not found

## 2023-10-19 NOTE — Progress Notes (Signed)
 Subjective:   Correen Kappes is a 89 y.o. female who presents for Medicare Annual (Subsequent) preventive examination.  Visit Complete: In person  Patient Medicare AWV questionnaire was completed by the patient on 10/19/23 ; I have confirmed that all information answered by patient is correct and no changes since this date.        Objective:    Today's Vitals   10/19/23 1446  BP: 138/88  Pulse: 70  Temp: 98.5 F (36.9 C)  SpO2: 94%  Weight: 141 lb (64 kg)  Height: 5' 3 (1.6 m)   Body mass index is 24.98 kg/m.     10/19/2023    2:50 PM 08/17/2023    1:21 PM 04/20/2023    2:53 PM 03/11/2023    1:00 PM 09/16/2021    8:46 AM 07/21/2021   10:47 AM 07/21/2021   10:44 AM  Advanced Directives  Does Patient Have a Medical Advance Directive? Yes Yes Yes Yes Yes Yes No  Type of Estate agent of [place];Out of facility DNR (pink MOST or yellow form) Healthcare Power of [place];Living will;Out of facility DNR (pink MOST or yellow form) Healthcare Power of [place];Out of facility DNR (pink MOST or yellow form);Living will Healthcare Power of [place];Out of facility DNR (pink MOST or yellow form);Living will Healthcare Power of [place];Living will Out of facility DNR (pink MOST or yellow form)   Does patient want to make changes to medical advance directive? No - Patient declined No - Patient declined No - Patient declined No - Patient declined No - Patient declined No - Patient declined   Copy of Healthcare Power of Attorney in Chart? No - copy requested No - copy requested No - copy requested No - copy requested No - copy requested    Would patient like information on creating a medical advance directive?       No - Patient declined    Current Medications (verified) Outpatient Encounter Medications as of 10/19/2023  Medication Sig   acetaminophen  (TYLENOL ) 500 MG tablet Take 1,000 mg by mouth 2 (two) times daily.   cholecalciferol  (VITAMIN D3) 25 MCG (1000  UNIT) tablet Take 1,000 Units by mouth daily.   Ciclopirox  1 % shampoo Apply 1 Application topically 2 (two) times a week. Wash scalp, let sit 10 minutes and rinse out   Cranberry-Vitamin C-Vitamin E (CRANBERRY PLUS VITAMIN C PO) Take 1 tablet by mouth daily.   Dextran 70-Hypromellose (LUBRICANT EYE DROPS, PF, OP) Place 1 drop into both eyes 2 (two) times daily.   hydrocortisone  2.5 % cream Apply topically 2 (two) times daily as needed (Rash). Apply to aa rash on neck bid prn flares   valACYclovir  (VALTREX ) 500 MG tablet Take 1 tablet (500 mg total) by mouth daily.   [DISCONTINUED] citalopram  (CELEXA ) 40 MG tablet Take 1 tablet (40 mg total) by mouth daily.   [DISCONTINUED] Vitamin D , Ergocalciferol , (DRISDOL ) 1.25 MG (50000 UNIT) CAPS capsule Take 1 capsule (50,000 Units total) by mouth every 7 (seven) days.   citalopram  (CELEXA ) 40 MG tablet Take 1 tablet (40 mg total) by mouth daily.   No facility-administered encounter medications on file as of 10/19/2023.    Allergies (verified) Sulfa antibiotics   History: Past Medical History:  Diagnosis Date   Actinic keratosis 02/03/2017   R elbow - bx proven, LN2 07/21/20   Anxiety    Arthritis    osteoarthritis   Basal cell carcinoma 02/03/2017   R lower eyelid    Colitis  Herpes    HOH (hard of hearing)    Squamous cell carcinoma of skin 02/03/2017   L distal shin    Past Surgical History:  Procedure Laterality Date   CATARACT EXTRACTION W/PHACO Right 11/20/2019   Procedure: CATARACT EXTRACTION PHACO AND INTRAOCULAR LENS PLACEMENT (IOC) RIGHT 10.10  01:02.2;  Surgeon: Jaye Fallow, MD;  Location: [facility];  Service: Ophthalmology;  Laterality: Right;   CATARACT EXTRACTION W/PHACO Left 12/11/2019   Procedure: CATARACT EXTRACTION PHACO AND INTRAOCULAR LENS PLACEMENT (IOC) LEFT 8.88 00:53.4;  Surgeon: Jaye Fallow, MD;  Location: [facility];  Service: Ophthalmology;  Laterality: Left;   IR KYPHO THORACIC  WITH BONE BIOPSY  09/16/2021   OPEN REDUCTION INTERNAL FIXATION (ORIF) DISTAL RADIAL FRACTURE Left 07/09/2019   Procedure: OPEN REDUCTION INTERNAL FIXATION (ORIF) DISTAL RADIAL FRACTURE;  Surgeon: Kathlynn Sharper, MD;  Location: [facility];  Service: Orthopedics;  Laterality: Left;   SACROPLASTY N/A 07/09/2019   Procedure: SACROPLASTY;  Surgeon: Kathlynn Sharper, MD;  Location: [facility];  Service: Orthopedics;  Laterality: N/A;   History reviewed. No pertinent family history. Social History   Socioeconomic History   Marital status: Widowed    Spouse name: Not on file   Number of children: Not on file   Years of education: Not on file   Highest education level: 12th grade  Occupational History   Not on file  Tobacco Use   Smoking status: Never   Smokeless tobacco: Never  Vaping Use   Vaping status: Never Used  Substance and Sexual Activity   Alcohol use: Yes    Comment: socially   Drug use: Not Currently   Sexual activity: Not Currently  Other Topics Concern   Not on file  Social History Narrative   Not on file   Social Drivers of Health   Financial Resource Strain: Low Risk  (04/16/2023)   Overall Financial Resource Strain (CARDIA)    Difficulty of Paying Living Expenses: Not hard at all  Food Insecurity: No Food Insecurity (10/19/2023)   Hunger Vital Sign    Worried About Running Out of Food in the Last Year: Never true    Ran Out of Food in the Last Year: Never true  Transportation Needs: No Transportation Needs (10/19/2023)   PRAPARE - Administrator, Civil Service (Medical): No    Lack of Transportation (Non-Medical): No  Physical Activity: Unknown (04/16/2023)   Exercise Vital Sign    Days of Exercise per Week: Patient declined    Minutes of Exercise per Session: Not on file  Stress: Patient Declined (04/16/2023)   [employer] of Occupational Health - Occupational Stress Questionnaire    Feeling of Stress : Patient declined  Social Connections: Unknown  (04/16/2023)   Social Connection and Isolation Panel    Frequency of Communication with Friends and Family: More than three times a week    Frequency of Social Gatherings with Friends and Family: Three times a week    Attends Religious Services: Patient declined    Active Member of Clubs or Organizations: Yes    Attends Banker Meetings: 1 to 4 times per year    Marital Status: Widowed    Tobacco Counseling Counseling given: Not Answered   Clinical Intake:                        Activities of Daily Living     No data to display  Patient Care Team: Abdul Fine, MD as PCP - General (Family Medicine)  Indicate any recent Medical Services you may have received from other than [facility] providers in the past year (date may be approximate).     Assessment:   This is a routine wellness examination for Alverda.  Hearing/Vision screen Vision Screening - Comments:: [facility] Last Exam: 09/2023   Goals Addressed   None    Depression Screen    10/19/2023    2:49 PM 08/17/2023    1:20 PM 04/20/2023    2:54 PM 03/11/2023    1:00 PM  PHQ 2/9 Scores  PHQ - 2 Score 0 0 0 0    Fall Risk    10/19/2023    2:49 PM 08/17/2023    1:20 PM 04/20/2023    2:53 PM 03/11/2023    1:00 PM  Fall Risk   Falls in the past year? 0 0 0 0  Number falls in past yr: 0 0 0 0  Injury with Fall? 0 0 0 0  Risk for fall due to : No Fall Risks Impaired balance/gait    Follow up Falls evaluation completed Falls evaluation completed      MEDICARE RISK AT HOME:    TIMED UP AND GO:  Was the test performed?  No    Cognitive Function:        10/19/2023    2:51 PM  6CIT Screen  What Year? 0 points  What month? 0 points  What time? 0 points  Count back from 20 0 points  Months in reverse 0 points  Repeat phrase 0 points  Total Score 0 points    Immunizations Immunization History  Administered Date(s) Administered   Influenza Inj Mdck Quad  Pf 12/10/2016   Influenza, High Dose Seasonal PF 12/23/2017   Influenza-Unspecified 12/31/2013, 01/23/2015, 12/21/2015, 01/08/2016, 12/10/2016, 12/23/2017, 12/28/2018, 01/04/2020, 01/04/2022, 12/21/2022   Moderna Covid-19 Fall Seasonal Vaccine 19yrs & older 12/28/2021   Moderna Sars-Covid-2 Vaccination 06/25/2019, 07/23/2019, 02/22/2020   Pneumococcal Conjugate-13 12/21/2015   Pneumococcal Polysaccharide-23 03/22/2006, 01/22/2016   Unspecified SARS-COV-2 Vaccination 01/13/2023    TDAP status: Due, Education has been provided regarding the importance of this vaccine. Advised may receive this vaccine at local pharmacy or Health Dept. Aware to provide a copy of the vaccination record if obtained from local pharmacy or Health Dept. Verbalized acceptance and understanding.  Flu Vaccine status: Up to date  Pneumococcal vaccine status: Up to date  Covid-19 vaccine status: Information provided on how to obtain vaccines.   Qualifies for Shingles Vaccine? Yes   Zostavax completed Previously completed, request records.   Shingrix Completed?: Yes  Screening Tests Health Maintenance  Topic Date Due   DTaP/Tdap/Td (1 - Tdap) Never done   Zoster Vaccines- Shingrix (1 of 2) Never done   COVID-19 Vaccine (6 - Moderna risk 2024-25 season) 07/14/2023   INFLUENZA VACCINE  10/21/2023   Medicare Annual Wellness (AWV)  10/18/2024   Pneumococcal Vaccine: 50+ Years  Completed   DEXA SCAN  Completed   Hepatitis B Vaccines  Aged Out   HPV VACCINES  Aged Out   Meningococcal B Vaccine  Aged Out    Health Maintenance  Health Maintenance Due  Topic Date Due   DTaP/Tdap/Td (1 - Tdap) Never done   Zoster Vaccines- Shingrix (1 of 2) Never done   COVID-19 Vaccine (6 - Moderna risk 2024-25 season) 07/14/2023    Colorectal cancer screening: No longer required.   Mammogram status: No longer required  due to age.  Bone Density status: Completed  . Results reflect: Bone density results: OSTEOPOROSIS. Repeat  every 1-2 years.  Lung Cancer Screening: (Low Dose CT Chest recommended if Age 55-80 years, 20 pack-year currently smoking OR have quit w/in 15years.) does not qualify.   Lung Cancer Screening Referral: NA Additional Screening:  Hepatitis C Screening: does qualify; Completed   Vision Screening: Recommended annual ophthalmology exams for early detection of glaucoma and other disorders of the eye. Is the patient up to date with their annual eye exam?  Yes  Who is the provider or what is the name of the office in which the patient attends annual eye exams? Porfilio, MD If pt is not established with a provider, would they like to be referred to a provider to establish care? No .   Dental Screening: Recommended annual dental exams for proper oral hygiene  Diabetic Foot Exam: NA  Community Resource Referral / Chronic Care Management: CRR required this visit?  No   CCM required this visit?  No     Plan:     I have personally reviewed and noted the following in the patient's chart:   Medical and social history Use of alcohol, tobacco or illicit drugs  Current medications and supplements including opioid prescriptions. Patient is not currently taking opioid prescriptions. Functional ability and status Nutritional status Physical activity Advanced directives List of other physicians Hospitalizations, surgeries, and ER visits in previous 12 months Vitals Screenings to include cognitive, depression, and falls Referrals and appointments  In addition, I have reviewed and discussed with patient certain preventive protocols, quality metrics, and best practice recommendations. A written personalized care plan for preventive services as well as general preventive health recommendations were provided to patient.     Richerd Brigham, MD   10/19/2023   After Visit Summary: (In Person-Printed) AVS printed and given to the patient  Nurse Notes: reviewed.

## 2023-11-06 IMAGING — MR MR THORACIC SPINE W/O CM
6 series · 33 of 48 positions shown · non-contrast
Comparison: CT abdomen July 29, 2021

CLINICAL DATA: Closed wedge compression fracture of T11 vertebra,
initial encounter (HCC) [id number].[id number] ([id number]).

EXAM:
MRI THORACIC AND LUMBAR SPINE WITHOUT CONTRAST
TECHNIQUE: Multiplanar and multiecho pulse sequences of the thoracic and lumbar
spine were obtained without intravenous contrast.

[Series 18: T1 · sagittal · 6.0mm · 1.88mm/px · 2 of 9 slices shown (1 of 2)]
[im 1/9]
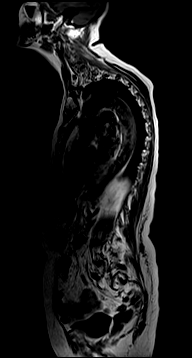
[im 9/9]
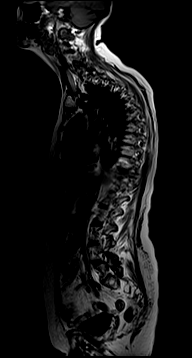

[Series 19: T2 · sagittal · 3.0mm · 1.33mm/px · 6 of 21 slices shown (1 of 2)]
[im 1/21]
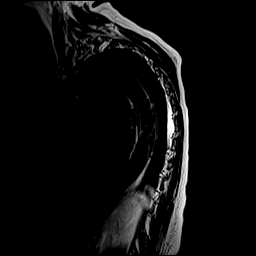
[im 5/21]
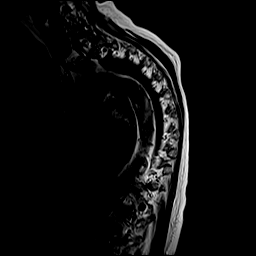
[im 9/21]
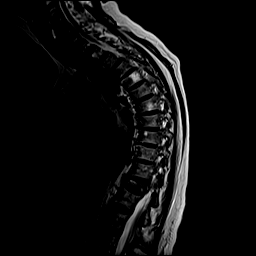
[im 13/21]
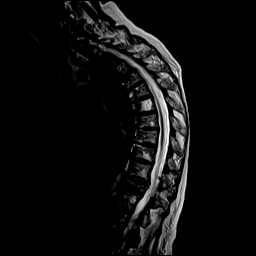
[im 17/21]
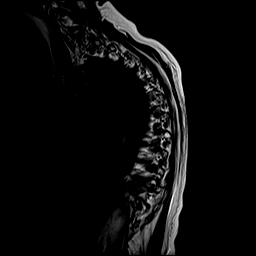
[im 21/21]
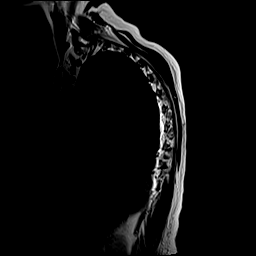

[Series 20: T1 · sagittal · 3.0mm · 1.33mm/px · 7 of 21 slices shown (2 of 2)]
[im 1/21]
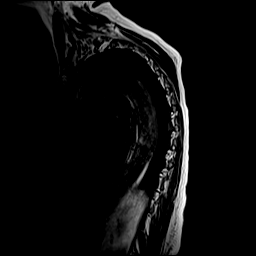
[im 4/21]
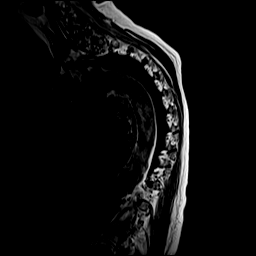
[im 7/21]
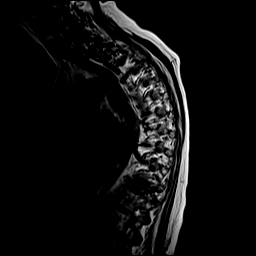
[im 11/21]
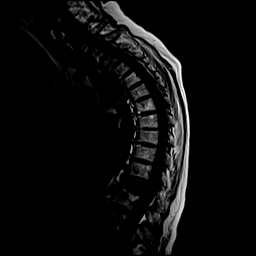
[im 14/21]
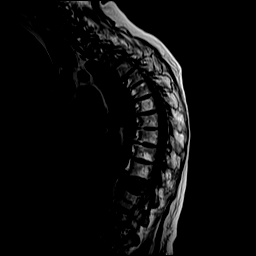
[im 17/21]
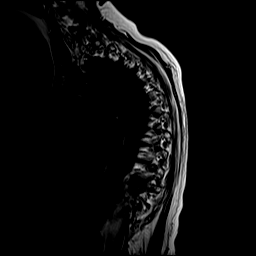
[im 21/21]
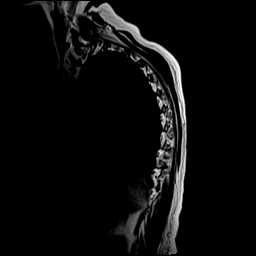

[Series 21: STIR · sagittal · 3.0mm · 0.66mm/px · 7 of 21 slices shown]
[im 1/21]
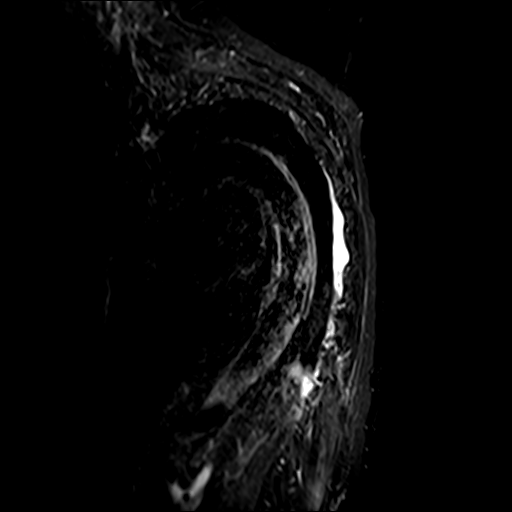
[im 4/21]
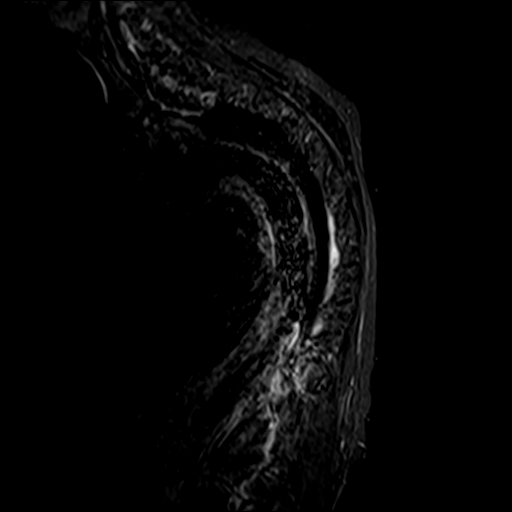
[im 7/21]
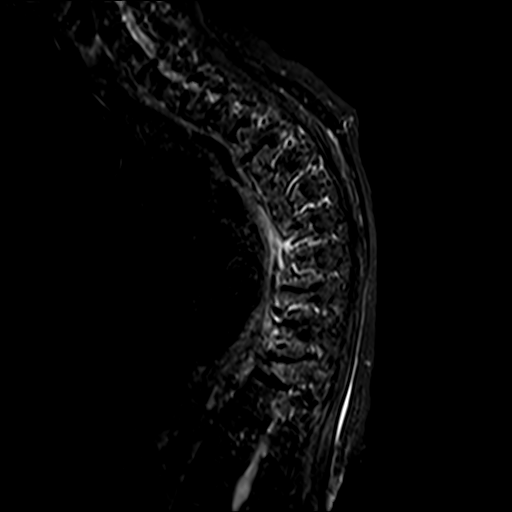
[im 11/21]
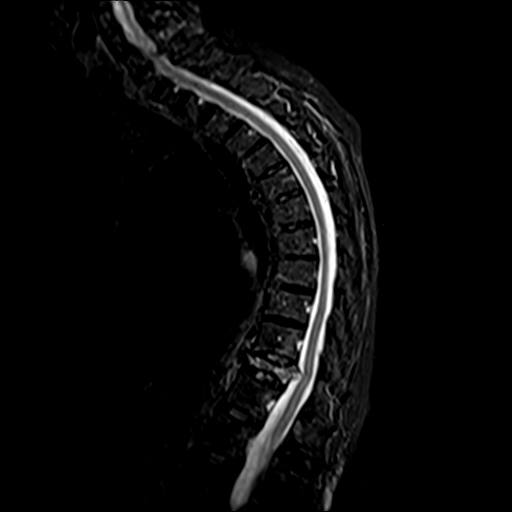
[im 14/21]
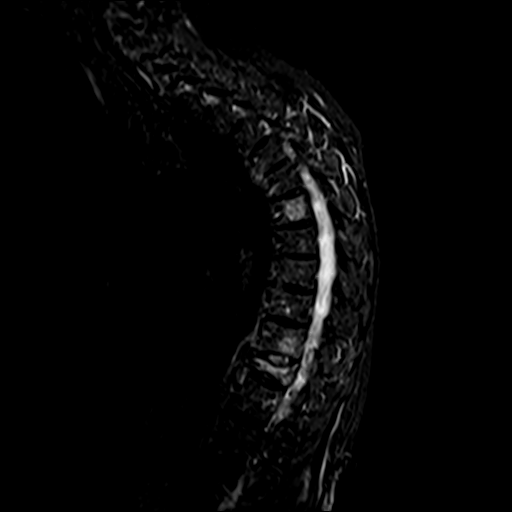
[im 17/21]
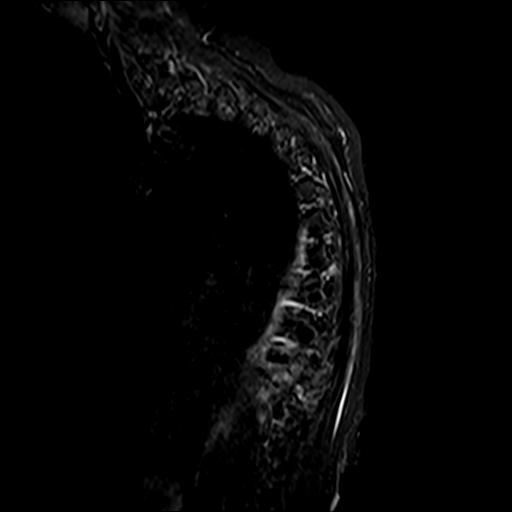
[im 21/21]
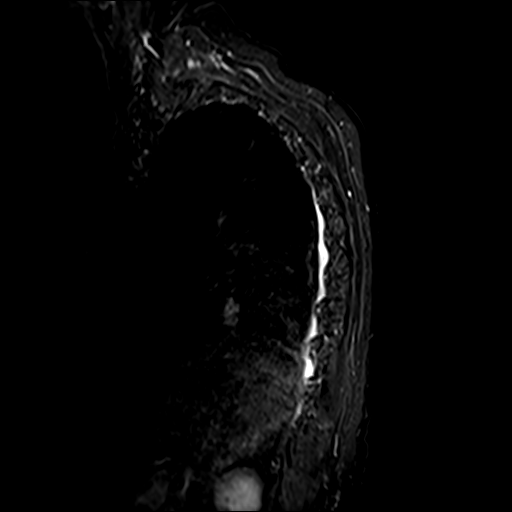

[Series 23: GRE · axial · 4.0mm · 0.37mm/px · z∈[-247,-214]mm · 2 of 40 slices shown]
[im 1/40]
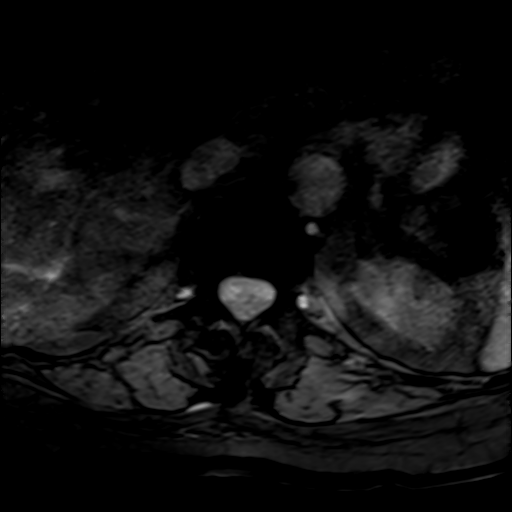
[im 7/40]
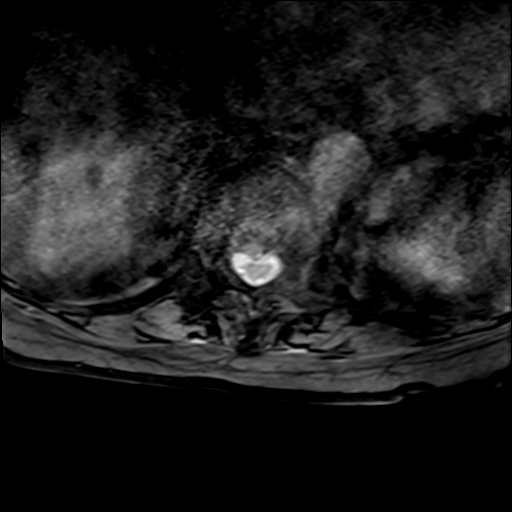

[Series 24: T2 · axial · 4.0mm · 0.59mm/px · z∈[-247,-107]mm · 9 of 40 slices shown (2 of 2)]
[im 1/40]
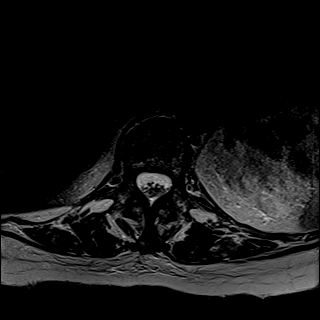
[im 7/40]
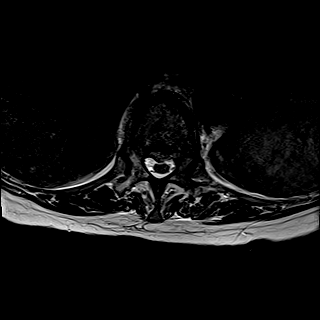
[im 14/40]
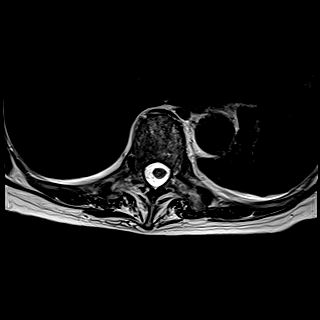
[im 17/40]
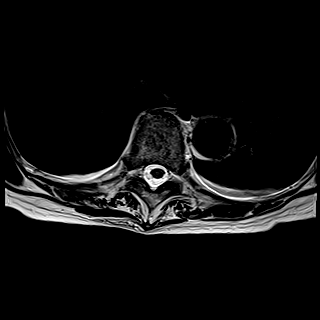
[im 20/40]
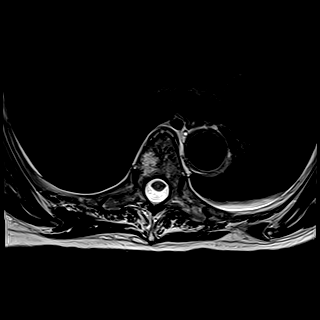
[im 23/40]
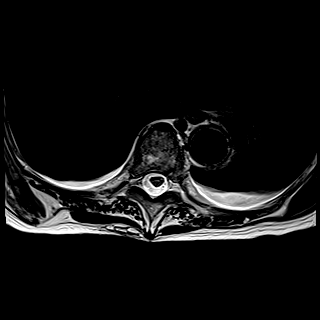
[im 27/40]
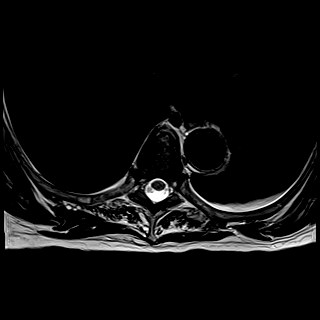
[im 33/40]
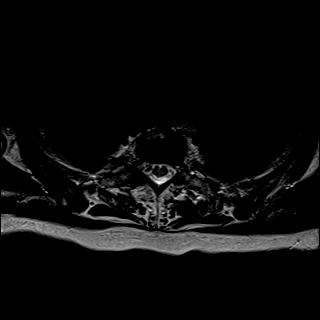
[im 40/40]
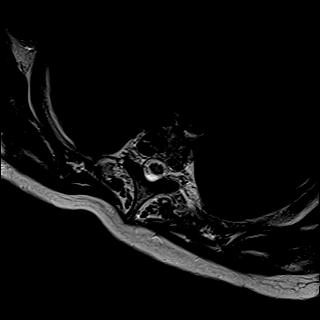

[33 of 48 positions shown; findings below may reference images not displayed]

FINDINGS: MRI THORACIC SPINE FINDINGS

Alignment:  Exaggerated thoracic kyphosis.

Vertebrae: Acute/subacute compression fracture of the T11 vertebral
body with approximately 80% height loss and central retropulsion
causing mild mass effect on the anterior cord surface. No evidence
of discitis or aggressive bone lesion.

Cord:  Normal signal characteristics.

Paraspinal and other soft tissues: Mild bilateral pleural effusion.

Disc levels:

At T10-11, retropulsion causes mild mass effect on the anterior cord
surface and results in mild spinal canal stenosis. Facet
degenerative changes contribute for mild bilateral neural foraminal
narrowing at this level.

No significant spinal canal or neural foraminal stenosis the
thoracic levels.

MRI LUMBAR SPINE FINDINGS

Segmentation:  Standard.

Alignment:  Trace anterolisthesis at L3-4.

Vertebrae: No fracture, evidence of discitis, or bone lesion. PMMA
noted in the left sacral ala.

Conus medullaris and cauda equina: Conus extends to the L1 level.
Conus and cauda equina appear normal.

Paraspinal and other soft tissues: Negative.

Disc levels:

L1-2: Mild facet degenerative changes. No spinal canal or neural
foraminal stenosis.

L2-3: Mild facet degenerative changes. No spinal canal or neural
foraminal stenosis.

L3-4: Shallow disc bulge, prominent hypertrophic facet degenerative
changes and mild ligamentum flavum redundancy resulting in mild
spinal canal stenosis. No significant neural foraminal narrowing.

L4-5: Shallow disc bulge and moderate hypertrophic facet
degenerative changes ligamentum flavum redundancy resulting in mild
spinal canal stenosis with mild narrowing of the bilateral
subarticular zones. No significant neural foraminal narrowing.

L5-S1: Loss of disc height, disc bulge with associated osteophytic
component and moderate to advanced hypertrophic facet degenerative
changes without significant spinal canal or neural foraminal
stenosis.
IMPRESSION: MR THORACIC SPINE IMPRESSION

Acute/subacute compression fracture of the T11 vertebral body with
approximately 80% height loss and central retropulsion causing mild
mass effect on the anterior cord surface.

MR LUMBAR SPINE IMPRESSION

Degenerative changes of the lumbar spine with mild spinal canal
stenosis at L3-4 and L4-5 mostly related to facet hypertrophy. No
high-grade spinal canal or neural foraminal stenosis.

## 2023-11-06 IMAGING — MR MR LUMBAR SPINE W/O CM
5 series · 30 of 48 positions shown · non-contrast
Comparison: CT abdomen July 29, 2021

CLINICAL DATA: Closed wedge compression fracture of T11 vertebra,
initial encounter (HCC) [id number].D4DW ([id number]).

EXAM:
MRI THORACIC AND LUMBAR SPINE WITHOUT CONTRAST
TECHNIQUE: Multiplanar and multiecho pulse sequences of the thoracic and lumbar
spine were obtained without intravenous contrast.

[Series 1: T2 · sagittal · 4.0mm · 0.81mm/px · 6 of 17 slices shown (1 of 2)]
[im 1/17]
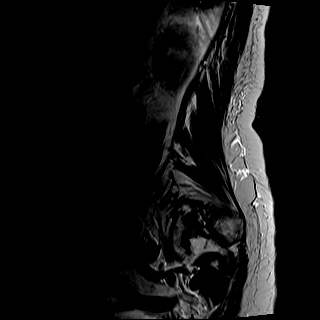
[im 4/17]
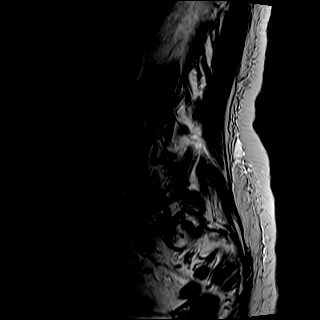
[im 7/17]
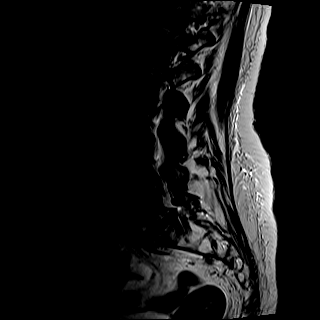
[im 10/17]
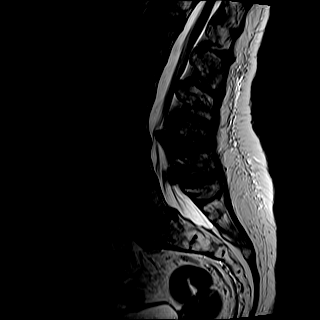
[im 13/17]
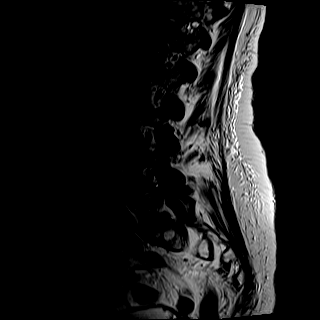
[im 17/17]
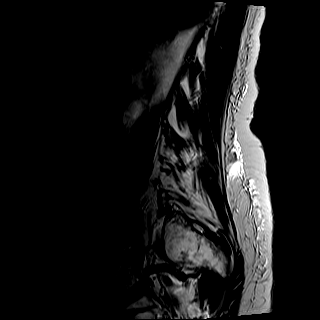

[Series 2: T1 · sagittal · 4.0mm · 0.81mm/px · 7 of 17 slices shown (1 of 2)]
[im 1/17]
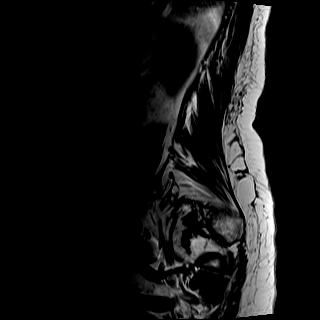
[im 3/17]
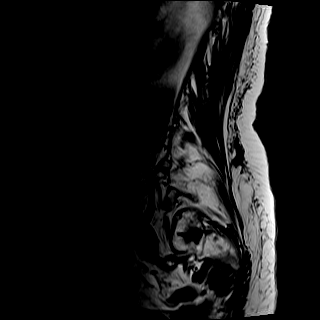
[im 6/17]
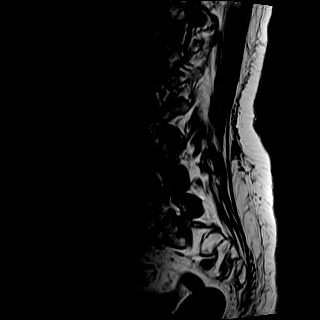
[im 9/17]
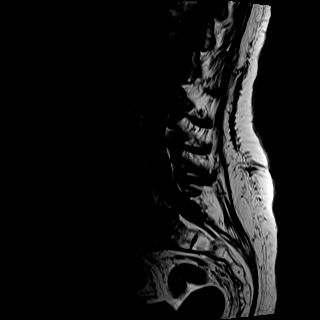
[im 11/17]
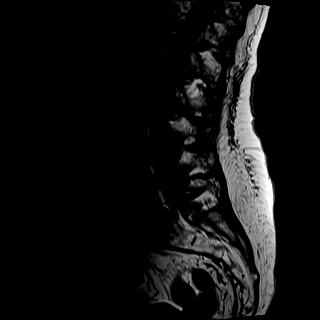
[im 14/17]
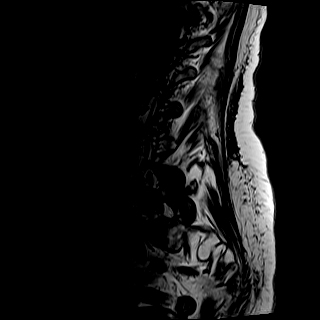
[im 17/17]
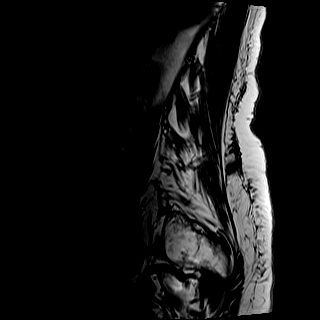

[Series 3: STIR · sagittal · 4.0mm · 0.41mm/px · 1 of 17 slices shown]
[im 1/17]
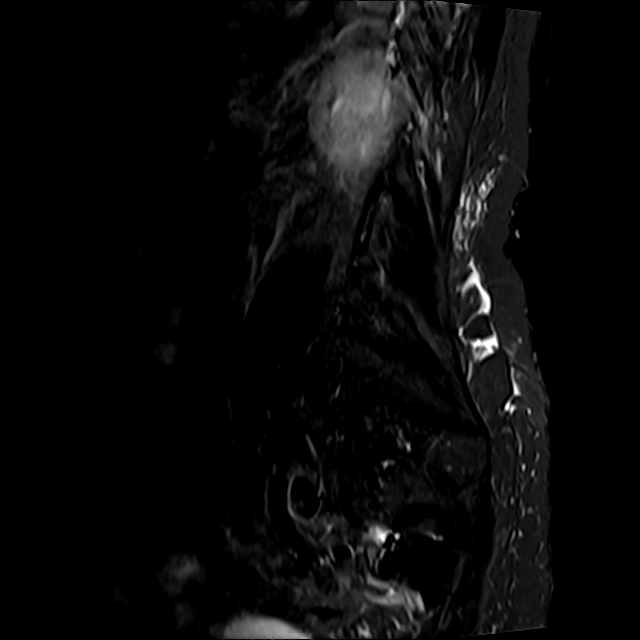

[Series 4: T2 · axial · 4.0mm · 0.78mm/px · z∈[-472,-256]mm · 8 of 34 slices shown (2 of 2)]
[im 1/34]
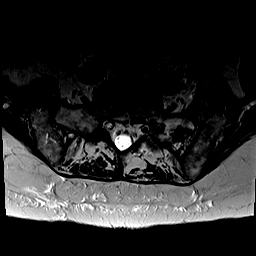
[im 6/34]
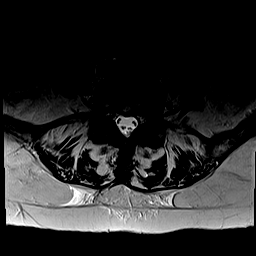
[im 11/34]
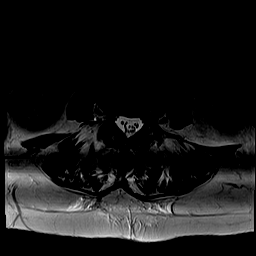
[im 16/34]
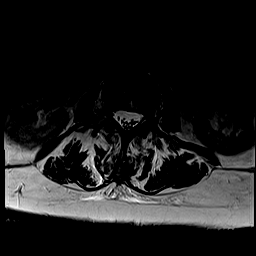
[im 18/34]
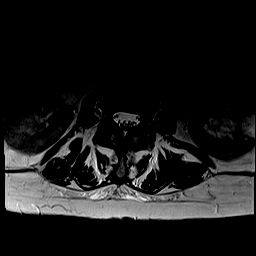
[im 23/34]
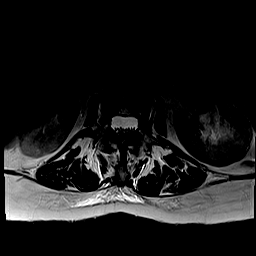
[im 28/34]
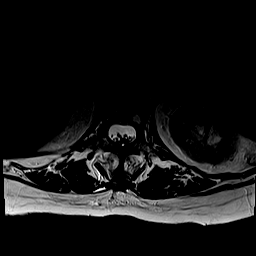
[im 34/34]
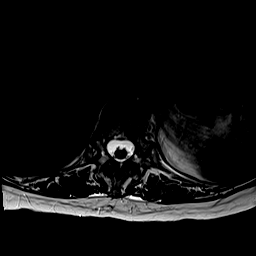

[Series 5: T1 · axial · 4.0mm · 0.39mm/px · z∈[-472,-256]mm · 8 of 34 slices shown (2 of 2)]
[im 1/34]
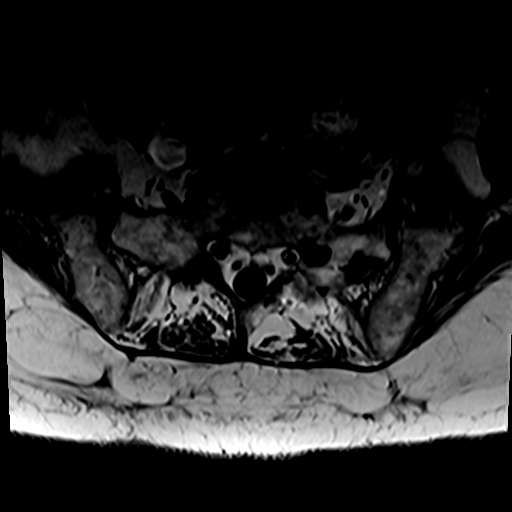
[im 6/34]
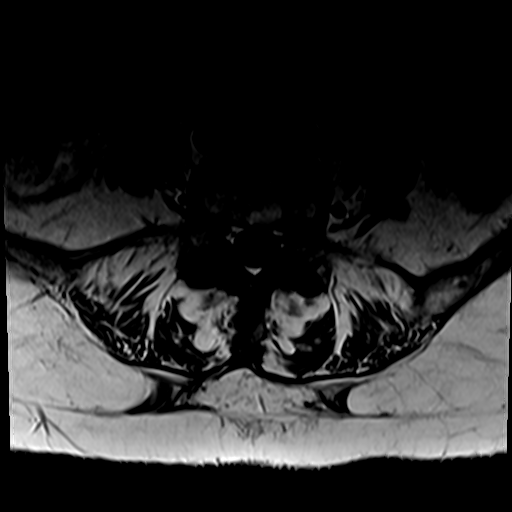
[im 11/34]
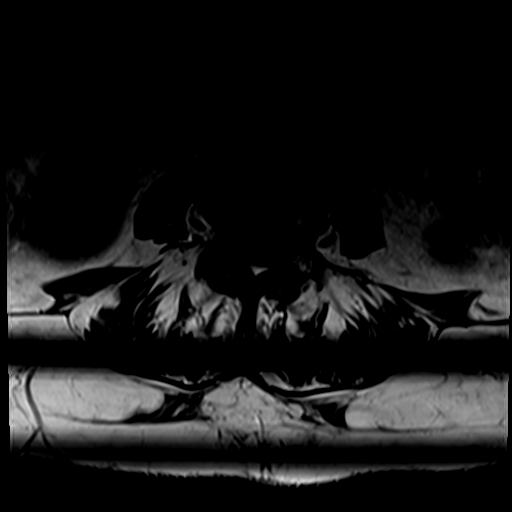
[im 16/34]
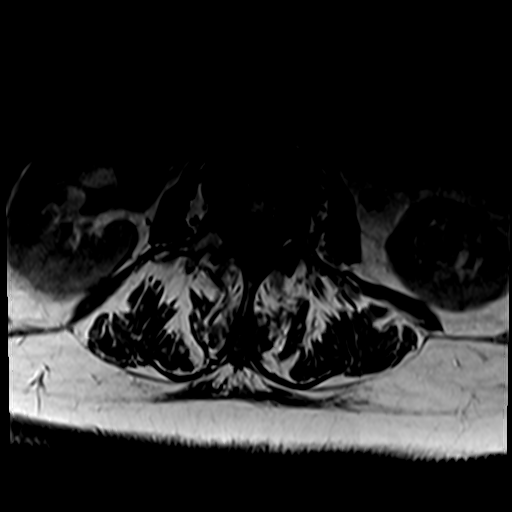
[im 18/34]
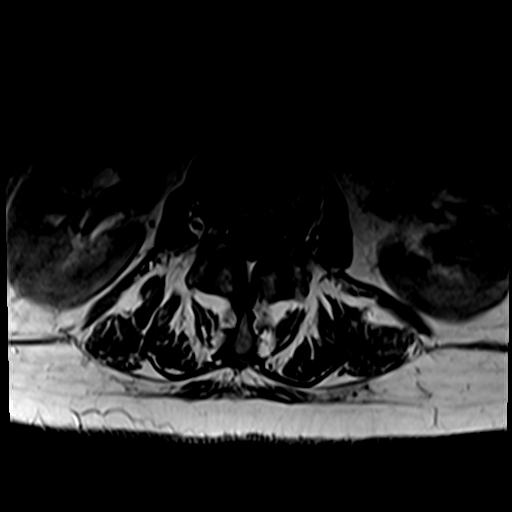
[im 23/34]
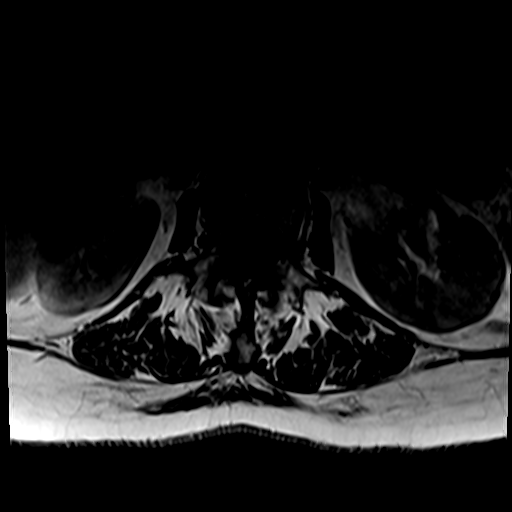
[im 28/34]
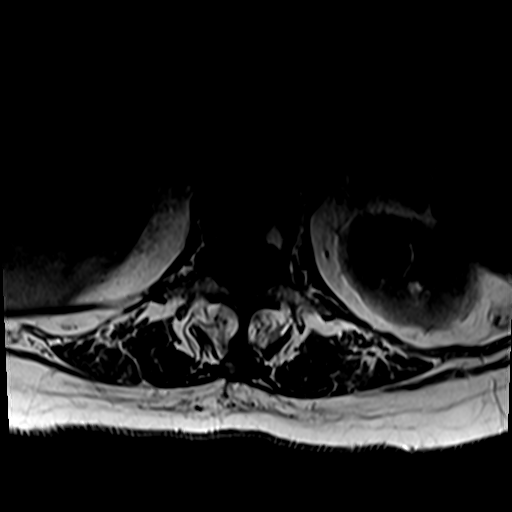
[im 34/34]
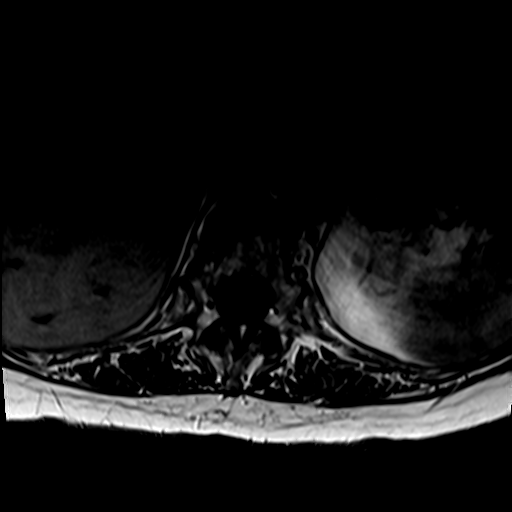

[30 of 48 positions shown; findings below may reference images not displayed]

FINDINGS: MRI THORACIC SPINE FINDINGS

Alignment:  Exaggerated thoracic kyphosis.

Vertebrae: Acute/subacute compression fracture of the T11 vertebral
body with approximately 80% height loss and central retropulsion
causing mild mass effect on the anterior cord surface. No evidence
of discitis or aggressive bone lesion.

Cord:  Normal signal characteristics.

Paraspinal and other soft tissues: Mild bilateral pleural effusion.

Disc levels:

At T10-11, retropulsion causes mild mass effect on the anterior cord
surface and results in mild spinal canal stenosis. Facet
degenerative changes contribute for mild bilateral neural foraminal
narrowing at this level.

No significant spinal canal or neural foraminal stenosis the
thoracic levels.

MRI LUMBAR SPINE FINDINGS

Segmentation:  Standard.

Alignment:  Trace anterolisthesis at L3-4.

Vertebrae: No fracture, evidence of discitis, or bone lesion. PMMA
noted in the left sacral ala.

Conus medullaris and cauda equina: Conus extends to the L1 level.
Conus and cauda equina appear normal.

Paraspinal and other soft tissues: Negative.

Disc levels:

L1-2: Mild facet degenerative changes. No spinal canal or neural
foraminal stenosis.

L2-3: Mild facet degenerative changes. No spinal canal or neural
foraminal stenosis.

L3-4: Shallow disc bulge, prominent hypertrophic facet degenerative
changes and mild ligamentum flavum redundancy resulting in mild
spinal canal stenosis. No significant neural foraminal narrowing.

L4-5: Shallow disc bulge and moderate hypertrophic facet
degenerative changes ligamentum flavum redundancy resulting in mild
spinal canal stenosis with mild narrowing of the bilateral
subarticular zones. No significant neural foraminal narrowing.

L5-S1: Loss of disc height, disc bulge with associated osteophytic
component and moderate to advanced hypertrophic facet degenerative
changes without significant spinal canal or neural foraminal
stenosis.
IMPRESSION: MR THORACIC SPINE IMPRESSION

Acute/subacute compression fracture of the T11 vertebral body with
approximately 80% height loss and central retropulsion causing mild
mass effect on the anterior cord surface.

MR LUMBAR SPINE IMPRESSION

Degenerative changes of the lumbar spine with mild spinal canal
stenosis at L3-4 and L4-5 mostly related to facet hypertrophy. No
high-grade spinal canal or neural foraminal stenosis.

## 2024-04-23 ENCOUNTER — Other Ambulatory Visit: Payer: Self-pay

## 2024-04-23 DIAGNOSIS — F411 Generalized anxiety disorder: Secondary | ICD-10-CM

## 2024-04-23 MED ORDER — CITALOPRAM HYDROBROMIDE 40 MG PO TABS: 40.0000 mg | ORAL_TABLET | Freq: Every day | ORAL | 1 refills | Status: AC

## 2024-04-25 ENCOUNTER — Encounter: Payer: Self-pay | Admitting: Student

## 2024-05-02 ENCOUNTER — Encounter: Admitting: Internal Medicine
